# Patient Record
Sex: Female | Born: 1975 | Race: Black or African American | Hispanic: No | Marital: Married | Smoking: Never smoker
Health system: Southern US, Community
[De-identification: ages and names within clinical notes are randomized; demographics above are authoritative.]

## PROBLEM LIST (undated history)

## (undated) ENCOUNTER — Inpatient Hospital Stay (HOSPITAL_COMMUNITY): Payer: Self-pay

## (undated) DIAGNOSIS — F419 Anxiety disorder, unspecified: Secondary | ICD-10-CM

## (undated) DIAGNOSIS — I10 Essential (primary) hypertension: Secondary | ICD-10-CM

## (undated) DIAGNOSIS — O009 Unspecified ectopic pregnancy without intrauterine pregnancy: Secondary | ICD-10-CM

## (undated) DIAGNOSIS — Z5189 Encounter for other specified aftercare: Secondary | ICD-10-CM

## (undated) HISTORY — PX: NO PAST SURGERIES: SHX2092

---

## 1997-09-13 ENCOUNTER — Emergency Department (HOSPITAL_COMMUNITY): Admission: EM | Admit: 1997-09-13 | Discharge: 1997-09-13 | Payer: Self-pay | Admitting: Emergency Medicine

## 1999-06-14 ENCOUNTER — Other Ambulatory Visit: Admission: RE | Admit: 1999-06-14 | Discharge: 1999-06-14 | Payer: Self-pay | Admitting: Obstetrics and Gynecology

## 1999-06-15 ENCOUNTER — Other Ambulatory Visit: Admission: RE | Admit: 1999-06-15 | Discharge: 1999-06-15 | Payer: Self-pay | Admitting: Obstetrics and Gynecology

## 1999-06-15 ENCOUNTER — Encounter (INDEPENDENT_AMBULATORY_CARE_PROVIDER_SITE_OTHER): Payer: Self-pay | Admitting: Specialist

## 1999-07-23 ENCOUNTER — Encounter (INDEPENDENT_AMBULATORY_CARE_PROVIDER_SITE_OTHER): Payer: Self-pay

## 1999-07-23 ENCOUNTER — Other Ambulatory Visit: Admission: RE | Admit: 1999-07-23 | Discharge: 1999-07-23 | Payer: Self-pay | Admitting: Obstetrics and Gynecology

## 2000-12-01 ENCOUNTER — Other Ambulatory Visit: Admission: RE | Admit: 2000-12-01 | Discharge: 2000-12-01 | Payer: Self-pay | Admitting: Obstetrics and Gynecology

## 2003-02-13 ENCOUNTER — Emergency Department (HOSPITAL_COMMUNITY): Admission: EM | Admit: 2003-02-13 | Discharge: 2003-02-13 | Payer: Self-pay | Admitting: Emergency Medicine

## 2003-11-16 ENCOUNTER — Emergency Department (HOSPITAL_COMMUNITY): Admission: EM | Admit: 2003-11-16 | Discharge: 2003-11-16 | Payer: Self-pay | Admitting: Emergency Medicine

## 2003-11-24 ENCOUNTER — Emergency Department (HOSPITAL_COMMUNITY): Admission: EM | Admit: 2003-11-24 | Discharge: 2003-11-24 | Payer: Self-pay | Admitting: *Deleted

## 2007-02-16 ENCOUNTER — Inpatient Hospital Stay (HOSPITAL_COMMUNITY): Admission: AD | Admit: 2007-02-16 | Discharge: 2007-02-16 | Payer: Self-pay | Admitting: Obstetrics and Gynecology

## 2007-03-21 ENCOUNTER — Inpatient Hospital Stay (HOSPITAL_COMMUNITY): Admission: AD | Admit: 2007-03-21 | Discharge: 2007-03-21 | Payer: Self-pay | Admitting: Obstetrics and Gynecology

## 2007-05-06 ENCOUNTER — Inpatient Hospital Stay (HOSPITAL_COMMUNITY): Admission: AD | Admit: 2007-05-06 | Discharge: 2007-05-11 | Payer: Self-pay | Admitting: Obstetrics and Gynecology

## 2007-05-14 ENCOUNTER — Encounter: Admission: RE | Admit: 2007-05-14 | Discharge: 2007-06-13 | Payer: Self-pay | Admitting: Obstetrics and Gynecology

## 2007-06-15 ENCOUNTER — Encounter: Admission: RE | Admit: 2007-06-15 | Discharge: 2007-07-15 | Payer: Self-pay | Admitting: Obstetrics and Gynecology

## 2007-07-16 ENCOUNTER — Encounter: Admission: RE | Admit: 2007-07-16 | Discharge: 2007-08-14 | Payer: Self-pay | Admitting: Obstetrics and Gynecology

## 2007-08-15 ENCOUNTER — Encounter: Admission: RE | Admit: 2007-08-15 | Discharge: 2007-09-14 | Payer: Self-pay | Admitting: Obstetrics and Gynecology

## 2007-09-15 ENCOUNTER — Encounter: Admission: RE | Admit: 2007-09-15 | Discharge: 2007-10-15 | Payer: Self-pay | Admitting: Obstetrics and Gynecology

## 2007-10-02 ENCOUNTER — Emergency Department (HOSPITAL_COMMUNITY): Admission: EM | Admit: 2007-10-02 | Discharge: 2007-10-02 | Payer: Self-pay | Admitting: Emergency Medicine

## 2007-10-03 ENCOUNTER — Emergency Department (HOSPITAL_COMMUNITY): Admission: EM | Admit: 2007-10-03 | Discharge: 2007-10-03 | Payer: Self-pay | Admitting: Emergency Medicine

## 2007-10-16 ENCOUNTER — Encounter: Admission: RE | Admit: 2007-10-16 | Discharge: 2007-11-15 | Payer: Self-pay | Admitting: Obstetrics and Gynecology

## 2007-11-16 ENCOUNTER — Encounter: Admission: RE | Admit: 2007-11-16 | Discharge: 2007-12-16 | Payer: Self-pay | Admitting: Obstetrics and Gynecology

## 2007-12-17 ENCOUNTER — Encounter: Admission: RE | Admit: 2007-12-17 | Discharge: 2008-01-15 | Payer: Self-pay | Admitting: Obstetrics and Gynecology

## 2008-01-16 ENCOUNTER — Encounter: Admission: RE | Admit: 2008-01-16 | Discharge: 2008-02-15 | Payer: Self-pay | Admitting: Obstetrics and Gynecology

## 2008-02-16 ENCOUNTER — Encounter: Admission: RE | Admit: 2008-02-16 | Discharge: 2008-03-17 | Payer: Self-pay | Admitting: Obstetrics and Gynecology

## 2008-03-18 ENCOUNTER — Encounter: Admission: RE | Admit: 2008-03-18 | Discharge: 2008-04-14 | Payer: Self-pay | Admitting: Obstetrics and Gynecology

## 2008-04-15 ENCOUNTER — Encounter: Admission: RE | Admit: 2008-04-15 | Discharge: 2008-05-15 | Payer: Self-pay | Admitting: Obstetrics and Gynecology

## 2008-05-16 ENCOUNTER — Encounter: Admission: RE | Admit: 2008-05-16 | Discharge: 2008-06-14 | Payer: Self-pay | Admitting: Obstetrics and Gynecology

## 2008-06-08 ENCOUNTER — Inpatient Hospital Stay (HOSPITAL_COMMUNITY): Admission: AD | Admit: 2008-06-08 | Discharge: 2008-06-10 | Payer: Self-pay | Admitting: Obstetrics and Gynecology

## 2008-06-15 ENCOUNTER — Encounter: Admission: RE | Admit: 2008-06-15 | Discharge: 2008-07-15 | Payer: Self-pay | Admitting: Obstetrics and Gynecology

## 2008-07-16 ENCOUNTER — Encounter: Admission: RE | Admit: 2008-07-16 | Discharge: 2008-08-14 | Payer: Self-pay | Admitting: Obstetrics and Gynecology

## 2008-08-15 ENCOUNTER — Encounter: Admission: RE | Admit: 2008-08-15 | Discharge: 2008-09-14 | Payer: Self-pay | Admitting: Obstetrics and Gynecology

## 2008-09-15 ENCOUNTER — Encounter: Admission: RE | Admit: 2008-09-15 | Discharge: 2008-10-15 | Payer: Self-pay | Admitting: Obstetrics and Gynecology

## 2008-10-16 ENCOUNTER — Encounter: Admission: RE | Admit: 2008-10-16 | Discharge: 2008-11-14 | Payer: Self-pay | Admitting: Obstetrics and Gynecology

## 2008-11-15 ENCOUNTER — Encounter: Admission: RE | Admit: 2008-11-15 | Discharge: 2008-12-15 | Payer: Self-pay | Admitting: Obstetrics and Gynecology

## 2008-12-16 ENCOUNTER — Encounter: Admission: RE | Admit: 2008-12-16 | Discharge: 2009-01-14 | Payer: Self-pay | Admitting: Obstetrics and Gynecology

## 2009-01-15 ENCOUNTER — Encounter: Admission: RE | Admit: 2009-01-15 | Discharge: 2009-02-12 | Payer: Self-pay | Admitting: Obstetrics and Gynecology

## 2009-02-15 ENCOUNTER — Encounter: Admission: RE | Admit: 2009-02-15 | Discharge: 2009-03-17 | Payer: Self-pay | Admitting: Obstetrics and Gynecology

## 2009-03-18 ENCOUNTER — Encounter: Admission: RE | Admit: 2009-03-18 | Discharge: 2009-04-17 | Payer: Self-pay | Admitting: Obstetrics and Gynecology

## 2009-05-16 ENCOUNTER — Encounter: Admission: RE | Admit: 2009-05-16 | Discharge: 2009-06-15 | Payer: Self-pay | Admitting: Obstetrics and Gynecology

## 2009-06-16 ENCOUNTER — Encounter: Admission: RE | Admit: 2009-06-16 | Discharge: 2009-07-16 | Payer: Self-pay | Admitting: Obstetrics and Gynecology

## 2010-03-03 ENCOUNTER — Ambulatory Visit: Admit: 2010-03-03 | Payer: Self-pay | Admitting: Obstetrics and Gynecology

## 2010-05-26 LAB — URINALYSIS, ROUTINE W REFLEX MICROSCOPIC
Glucose, UA: NEGATIVE mg/dL
Ketones, ur: NEGATIVE mg/dL
Nitrite: NEGATIVE
Protein, ur: NEGATIVE mg/dL
pH: 7 (ref 5.0–8.0)

## 2010-05-26 LAB — CBC
HCT: 33.1 % — ABNORMAL LOW (ref 36.0–46.0)
MCHC: 34.3 g/dL (ref 30.0–36.0)
MCHC: 35 g/dL (ref 30.0–36.0)
MCV: 93.9 fL (ref 78.0–100.0)
Platelets: 183 10*3/uL (ref 150–400)
Platelets: 209 10*3/uL (ref 150–400)
Platelets: 212 10*3/uL (ref 150–400)
RBC: 3.23 MIL/uL — ABNORMAL LOW (ref 3.87–5.11)
RDW: 14.7 % (ref 11.5–15.5)
RDW: 15.3 % (ref 11.5–15.5)
WBC: 10.4 10*3/uL (ref 4.0–10.5)
WBC: 11.7 10*3/uL — ABNORMAL HIGH (ref 4.0–10.5)

## 2010-05-26 LAB — COMPREHENSIVE METABOLIC PANEL
ALT: 12 U/L (ref 0–35)
AST: 21 U/L (ref 0–37)
Albumin: 2.6 g/dL — ABNORMAL LOW (ref 3.5–5.2)
Alkaline Phosphatase: 163 U/L — ABNORMAL HIGH (ref 39–117)
CO2: 26 mEq/L (ref 19–32)
Chloride: 109 mEq/L (ref 96–112)
GFR calc non Af Amer: >60 mL/min (ref >60)
Potassium: 3.8 mEq/L (ref 3.5–5.1)
Total Bilirubin: 0.2 mg/dL — ABNORMAL LOW (ref 0.3–1.2)

## 2010-06-28 ENCOUNTER — Other Ambulatory Visit (HOSPITAL_COMMUNITY)
Admission: RE | Admit: 2010-06-28 | Discharge: 2010-06-28 | Disposition: A | Discharge status: Home / Self Care | Payer: BC Managed Care – PPO | Source: Ambulatory Visit | Attending: Obstetrics and Gynecology | Admitting: Obstetrics and Gynecology

## 2010-06-28 ENCOUNTER — Other Ambulatory Visit: Payer: Self-pay | Admitting: Obstetrics and Gynecology

## 2010-06-28 DIAGNOSIS — Z113 Encounter for screening for infections with a predominantly sexual mode of transmission: Secondary | ICD-10-CM | POA: Insufficient documentation

## 2010-06-28 DIAGNOSIS — Z01419 Encounter for gynecological examination (general) (routine) without abnormal findings: Secondary | ICD-10-CM | POA: Insufficient documentation

## 2010-06-29 NOTE — H&P (Signed)
[name], [name]           ACCOUNT NO.:  [id number]   MEDICAL RECORD NO.:  [id number]          PATIENT TYPE:  INP   LOCATION:  9166                          FACILITY:  [facility]   PHYSICIAN:  Hal Morales, M.D.DATE OF BIRTH:  12-07-1975   DATE OF ADMISSION:  06/08/2008  DATE OF DISCHARGE:                              HISTORY & PHYSICAL   The patient is a 35 year old married black female gravida 4, para 2-1-0-  3 at 40-1/7 weeks per an [facility] of June 07, 2008, who presented  unannounced in active labor.  She woke around 5 a.m. with labor onset.  She had a small amount of vaginal bleeding per report, no gushes of  fluid.  Denies nausea, vomiting, diarrhea, PIH or UTI signs or symptoms,  fever, cough or shortness of breath.  Reports good fetal movement.  Denies any HSV prodromal signs or symptoms.  She has been on suppressive  Valtrex since May 22, 2008, when a lesion was diagnosed by Dr. 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  She has been followed by the MD service at  [facility].   Her history is remarkable for:  1. History of preterm delivery x1.  That was in 1995 and was a 24-      weeker who weighed 3 pounds 12 ounces. At  this pregnancy she was      on 17P shots until around 29 weeks when she did have start having      some local reaction which was bothersome to her and therefore      discontinued.  2. History of LEEP.  3. History of postpartum pulmonary edema and cardiomegaly  4. History of PIH x3 pregnancies.  5. History of postpartum depression.  6. Questionable LMP.  7. History of rapid labor.   For contraception, she has used Depo in the past, history of Yasmin.  She did conceive on Micronor this pregnancy and is interested in the  Mirena versus postpartum BTL.   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  She did receive  a blood transfusion in 2009 during the postpartum period for  the  hemoglobin been 4.9.  She had a LEEP in 2001.  She reports an epidural  that she had with her first delivery came out, had to be put back in  and back problems since.  Her only other hospitalizations have been  with childbirth.   ALLERGIES:  She denies medication or latex allergies; no other  sensitivities.   OBSTETRICAL HISTORY:  Gravida 1:  Spontaneous vaginal delivery at 38  weeks, female, in September 1994; weight 5 pounds 6 ounces and 12 hours  of labor.  Did have an epidural.  No complications.  Gravida 2:  Spontaneous vaginal delivery, around 24 weeks in December  1995, a female, weight 3 pounds 12 ounces.  No anesthesia.  Premature  rupture of membranes and child was in the NICU for 2 months.  Gravida 3:  Spontaneous vaginal delivery at 37 weeks on March 2009, a  female, weight 5 pounds  15 ounces, at 37 weeks and did have an epidural.  Also had preeclampsia, postpartum cardiomegaly as well postpartum  pulmonary edema and postpartum anemia.  Hemoglobin was down to 4.9.   FAMILY HISTORY:  Remarkable for father chronic hypertension and on meds.  Mother varicosities.  Paternal grandmother rheumatoid arthritis.  She is  deceased.  Genetic history was remarkable for first cousin on her  father's side who was born with extra digits and the patient did have a  history of heart murmur as a child.   SOCIAL HISTORY:  She is a married African American female.  Her  husband's name is Jenne Campus.  The patient has a bachelor's degree  and was working full time in customer service  Father of the baby has an  associates degree and works full-time at [employer] and [employer].  She did  ingest alcohol prior to pregnancy but denies tobacco or illicit drug.   PRENATAL LABS:  Blood type is A positive, RPR nonreactive, rubella titer  immune.  Hepatitis surface antigen negative, HIV negative.  Her  gonorrhea and chlamydia cultures in the first trimester were negative.  Her GBS was positive in  her third trimester.  She had a normal 1-hour  Glucola. She had a normal first trimester screen as well as negative  AFP.   HISTORY OF PRESENT PREGNANCY:  She entered care October 1 with a  questionable LMP.  She had a BMI of 22.  She was normotensive at her  first visit.  Her pregravid weight was 127, at that visit she was 134.  She is 5 feet 4 inches.  She also had conceived on the pill.  She did  receive 17P injections with her pregnancy prior to this one.  The  patient was tearful at her first visit secondary to the unplanned  pregnancy.  She was referred to that her psychiatrist at that time for  perinatal depression.  She had an ultrasound at 11-4/7 weeks giving her  best [facility] of June 04, 1938.  She had a normal first trimester screen.  She was on Macrobid prophylactically for suspected UTI at 12-2/7 weeks.  She started on Zoloft somewhere at the end of her first trimester or  beginning of her second trimester for continued depression.  She had her  anatomy ultrasound at 19-2/7 weeks.  She is expecting her fourth girl.  Ultrasounds consistent with a previous dating.  Cervix 3.48 cm, normal  fluid, posterior placenta, three-vessel cord, all anatomy was seen with  normal growth and development.  The patient did consent to 17P vaccine,  which was started November 30 and she did continue that until  approximately 29 weeks when she started having some localized complaints  and it was discontinued.  Measuring slightly less than dates, beginning  around 23-25 weeks.  She had a 1-hour Glucola at 27-2/7 weeks.  Her  hemoglobin was 10.3 and was placed on iron.  Her 1-hour Glucola was  within normal limits equal to 109.  She had her ultrasound at 29-2/7  weeks for measuring size less than dates.  Had AFI of 60 percentile and  growth was 71st percentile.  Cervix was 3.7 cm.  She had ultrasound at  33-4/7 weeks again for a size less than dates  persisting and estimated  fetal weight at that  time was 4 pounds 15 ounces.  She had positive GBS  third trimester.  At 37-5/7 weeks on April 8, the cervix was 3-4 cm, 50%  and -  2 station.  She was seen by Dr. Stefano Gaul and had a raised  whitehead on right inner buttock which was tender.  She was placed  immediately on 500 mg of Valtrex p.o. b.i.d. for 3 days and then 500 q.  day and had HSV-1 and 2 serum glycoproteins that were drawn and they did  come back positive.  Both C-section and vaginal delivery were discussed.  The patient did desire vaginal delivery.  The patient has subsequently  been on Valtrex for greater than 10 days and then presented this morning  in active labor at 40-1/7 weeks.   OBJECTIVE:  Her initial blood pressure was 147/120, that was during a  contraction. Her heart rate was 96.  The repeat was 136/79 after  contraction and heart rate was 131.  Fetal heart rate is 135, moderate  variability.  She is having moderate variables, deepest nadir to upper  70s and duration lasting anywhere from 20-60 seconds with abrupt return  to baseline spontaneously. GENERAL:  She is alert, oriented x3.  She  does have very labored breathing, grimace and discomfort with her  contractions.  HEENT:  Within normal limits, grossly intact.  CARDIOVASCULAR:  Regular rate and rhythm without murmur.  LUNGS: Clear to auscultation bilaterally.  ABDOMEN: Soft, nontender and gravid.  PELVIC:  On speculum exam it was very difficult secondary to low fetal  station and membranes and fetal head noted just as blades placed into  vagina so difficulty viewing vaginal walls and could not view cervix.  Externally, there were no lesions noted.  There was a healed spot on her  right inner buttocks which was the diagnosis area.  EXTREMITIES:  Without edema and DTRs were 1+ and no clonus.   IMPRESSION:  1. Intrauterine pregnancy at 40-1/7 weeks.  2. Group B strep positive.  3. Transitional labor.  4. Intact membranes.  5. Recent diagnosis of herpes  simplex virus and has been on Valtrex      for greater than 10 days and without signs or symptoms.  6. History of depression, perinatal depression as well as postpartum      depression.   PLAN  1. Admit to birthing suites with Dr. Pennie Rushing as the attending      physician.  2. Routine L&D orders.  3. Ampicillin IV per GBS protocol for advanced dilatation.  4. MD is to follow.  5. Will closely monitor blood pressures as well as any signs or      symptoms of depression.      Candice [place], [place]      Hal Morales, M.D.  Electronically Signed    CHS/MEDQ  D:  06/08/2008  T:  06/08/2008  Job:  [id number]

## 2010-06-29 NOTE — H&P (Signed)
[name], [name]           ACCOUNT NO.:  [id number]   MEDICAL RECORD NO.:  [id number]          PATIENT TYPE:  MAT   LOCATION:  MATC                          [facility]   PHYSICIAN:  Osborn Coho, M.D.   DATE OF BIRTH:  02-29-1975   DATE OF ADMISSION:  03/21/2007  DATE OF DISCHARGE:  03/21/2007                              HISTORY & PHYSICAL   HISTORY OF PRESENT ILLNESS:  This patient is a 35 year old African  American married female who was evaluated at the office at 36-2/[redacted] weeks  gestation.  She is a gravida 3, para 1-1-0-2.  During her regular office  visit, she was noted to have elevated blood pressures of 138/90 and  140/100 and the she also had 2+ protein on a urine specimen.  Her cervix  was noted to be 2- to 3-cm dilated, 50% effaced, vertex -1.  Her  pregnancy has been followed by the [facility] MD service  and has been remarkable for:  1. Preterm delivery at 24 weeks.  2. History of LEEP.  3. History of PIH.   Her prenatal labs were collected on October 27, 2006, and showed:  Hemoglobin 13.1, hematocrit 38.7, platelets 300,000.  Blood type A+,  antibody negative, sickle cell trait negative.  RPR nonreactive, rubella  immune, hepatitis B surface antigen negative, HIV nonreactive.  Pap  smear from July 2008 was within normal limits.  Toxoplasmosis labs were  negative.  An AFP from December 14, 2006, was within normal limits.  One-hour  Glucola from February 28, 2007, was normal.   HISTORY OF PRESENT PREGNANCY:  The patient presented for care at [name] on October 27, 2006, at 9-3/[redacted] weeks gestation.  It was  recommended that she receive 17B injections during the pregnancy due to  her history of preterm delivery.  She had a normal first trimester  screen at 12 weeks' gestation.  She had a normal AFP from December 14, 2006.  Anatomy ultrasound at 19 weeks' gestation shows growth consistent  with previous dating confirming [name] of May 28, 2007.  The cervical  length at 23 weeks' gestation was 4.3 cm.  She continued to receive 17B  injections weekly.  Ultrasound at 29 weeks' gestation shows growth  consistent with previous dating.  Ultrasound at 30-1/2 weeks' gestation  showed dynamic cervix.  The range from 4 cm to 2.3 cm with fundal  pressure.  The rest of her prenatal care was unremarkable.  Her OB  history is gravida 3, para 1-1-0-2.  In September 2004, she had a  vaginal delivery of a female infant weighing 5 pounds 6 ounces at 53  weeks' gestation after 12 hours of labor.  She had an epidural for  anesthesia.  The infant's name was [name].  In December 1995, she had  a vaginal delivery of a 24-week infant weighing 3 pounds 12 ounces after  3 hours of labor.  She had no anesthesia.  She reports premature rupture  of membranes.  The infant's name was [name] and that infant was in  the NICU for two  months.  This third  pregnancy is the current  pregnancy.  She reports having PIH with her first pregnancy.   ALLERGIES:  NO MEDICATION ALLERGIES.   PAST GYN HISTORY:  She experienced menarche at the age of 72 with 28-day  cycles lasting 5 days.  She has used Depo-Provera in the past for  contraception, with her last injection being October 2007.  She also  used the Deazmen three 3 months after the Depo.  She had a LEEP  procedure in 2001.  She reports having had the usual childhood  illnesses.   PAST SURGICAL HISTORY:  Remarkable for the LEEP in 2001.  She also  reports having back problems after her epidural with her first delivery.   FAMILY HISTORY:  The patient's father with hypertension.  Paternal  grandmother with rheumatoid arthritis.   GENETIC HISTORY:  Her first cousins on her dad's side with polydactyly.  The patient reports having a heart murmur as an infant.   SOCIAL HISTORY:  She is married to the father of the baby.  His name is  [name].  He is involved and supportive.  The patient has her  bachelor's  degree and is employed full time for [name].  The father of  baby has as associate's degree and is employed full time for [name].  They deny any alcohol, tobacco or illicit drug use with the  pregnancy.   OBJECTIVE:  VITAL SIGNS:  Blood pressures in the office were 138/91,  40/100.  Other vital signs were stable.  She was afebrile.  HEENT: Grossly within normal limits.  CHEST:  Clear to auscultation.  HEART:  Regular rate and rhythm.  ABDOMEN:  Gravid in contour with fundal height extending approximately  36 cm above pubic symphysis.  Fetal heart tones were in the 150s.  Contractions were rare and mild and cervix was 2-3 cm, 50% effaced,  vertex -1.  EXTREMITIES:  Within normal limits.   ASSESSMENT:  1. Intrauterine pregnancy at 36-2/7 weeks.  2. Gestational hypertension:   PLAN:  Admit to neonatal unit for PIH labs, 24-hour urine collection as  well as serial blood pressures.      Cam Hai, C.N.M.      Osborn Coho, M.D.  Electronically Signed    KS/MEDQ  D:  05/01/2007  T:  05/02/2007  Job:  [id number]

## 2010-06-29 NOTE — H&P (Signed)
NAMEJAZZLENE, HUOT           ACCOUNT NO.:  [id number]   MEDICAL RECORD NO.:  [id number]          PATIENT TYPE:  INP   LOCATION:  9160                          [facility]ACILITY:  [facility]   PHYSICIAN:  Crist Fat. Rivard, M.D. DATE OF BIRTH:  23-Jun-1975   DATE OF ADMISSION:  05/06/2007  DATE OF DISCHARGE:                              HISTORY & PHYSICAL   HISTORY OF PRESENT ILLNESS:  Ms. Kassa is a 35 year old gravida 3,  para 1-1-0-2 at 37 weeks who presents today with elevated blood  pressures.  She had had elevated blood pressures in the office this past  week.  However, declined admission for induction or any further  evaluation.  She called the CNM on call tonight with a blood pressure  that she took at [facility] of 160/112.  She then was instructed to come to  maternity admissions for evaluation.  While in maternity admissions  unit, her blood pressure was in the 160s-170s over 110-117.  She also  was noted to have greater than 300 mg of protein on a voided specimen.  She was contracting approximately every 4-6 minutes very mildly, and her  cervix was favorable.  She was therefore admitted to the hospital for  induction secondary to preeclampsia.  This pregnancy has been remarkable  for:  1. Previous PIH and questionable preeclampsia with her first      pregnancy.  2. Previous preterm delivery at 24 weeks in 1995 with 17P given this      pregnancy.  3. History of LEEP procedure.  4. Group B strep negative.   PRENATAL LABORATORIES:  Blood type is A+, Rh antibody negative, VDRL  nonreactive, rubella titer positive, hepatitis B surface antigen  negative, HIV is nonreactive, toxoplasmosis titers were negative.  Cystic fibrosis testing was declined.  Sickle cell testing was negative.  Pap was normal in July 2008.  GC and Chlamydia cultures were negative.  In September, first trimester screening was normal.  AFP was normal.  She had a normal Glucola.  Hemoglobin upon entering the practice  was  13.1.  It was 10.8 at 27 weeks.  She had a fetal fibronectin done at 29  weeks secondary to some dynamic cervix findings.  This was negative.  Group B strep culture was negative at 36 weeks.   HISTORY OF PRESENT PREGNANCY:  The patient entered care at approximately  9 weeks and 3 days.  She did elect to proceed with 17P injections.  These were begun at 16 weeks.  She was treated for BV at her first  visit.  She did want first trimester screening which was normal.  She  had an ultrasound at 12 weeks showing normal growth and normal cervical  length.  She started 17P at 16 weeks.  She had an ultrasound at 18 weeks  showing growth equal to dates and a posterior placenta with cervical  length of 3.0.  the cervix was checked at 23 weeks with cervix being  closed at approximately 50% and presenting part was floating.  Cervical  length was 4.3 cm Glucola was given at 25 weeks which was normal.  She  had another ultrasound at 30 weeks.  The cervix was 2.8 cm in length  with normal fluid.  Ultrasound of the cervical length was repeated again  at 30 weeks.  Her cervix was closed, 70% vertex at -1 station.  Fetal  fibronectin was done.  Work was discontinued.  Ultrasound showed a  dynamic cervix going from 4 to 2.3 cm with fundal pressure fetal  fibronectin was negative.  She had an ultrasound for growth at 32 weeks  showing AFI 15 and dynamic cervix ranging for 3.5-2.94.  The rest of her  pregnancy was uncomplicated until her last visit with Dr. Stefano Gaul.  Her  cervix at that time was 2 cm.  However, she was noted to have elevated  blood pressure and protein in the urine.  She was sent to maternity  admissions unit.  She was recommended to go to maternity admissions unit  for evaluation.  She did decline to proceed with this.  She then elected  to take her blood pressure at [facility] at [facility] and it was found to be  elevated.   OBSTETRICAL HISTORY:  In 1994, she had a female infant, vaginal  delivery  at 5 pounds 6 ounces at 38 weeks.  She was in labor 12 hours she had  epidural anesthesia.  She did have PIH with that pregnancy, but also  describes been on magnesium sulfate, so there likely was some  preeclampsia there as well.  In 1995, she had a vaginal birth of a  female infant weight 3 pounds 12 ounces at 24 weeks.  She was in labor 3  hours.  She had premature rupture of membranes and then presented to the  hospital completely dilated and that child is doing well but was in the  NICU for two months   MEDICAL HISTORY:  1. She was on Depo-Provera and stopped in October 2007.  2. She has also used Yasmin.  3. She had a LEEP procedure in 2001.  4. She does have an indoor cat.  5. She reports usual childhood illnesses.  6. During her first delivery, she had an epidural which came out, had      to put it back in.  She has back problems after this incident.   ALLERGIES:  None.   FAMILY HISTORY:  The patient's father is hypertensive on medication.  Her mother has varicose veins.  Her paternal grandmother has rheumatoid  arthritis is now deceased.   GENETIC HISTORY:  Remarkable for first cousin on the Dad's side has  extra digit.  The patient did have heart murmur as a baby but has had no  issues since.   SOCIAL HISTORY:  The patient is Tree surgeon.  She denies any  religious affiliation.  She is married to the father of the baby.  He is  involved and supportive.  His name is Reymundo Poll. Aguayo.  This is a new  pregnancy with this partner.  The patient has a bachelor's degree.  She  is employed at [employer].  Her partner has an Associate's  degree.  He is employed at [employer].  She has been followed by  the physician [facility] at [facility].  She denies any alcohol,  drug or tobacco use during this pregnancy.   PHYSICAL EXAMINATION:  VITAL SIGNS:  Blood pressure is ranging from 160-  170 over 110-118.  Pulse is ranging from 90-107, O2 sats  were 99% on  room air respirations are  20.  HEENT: Within normal limits.  LUNGS:  Breath sounds are clear.  HEART:  Regular rate and rhythm without murmur.  BREASTS:  Soft, nontender.  ABDOMEN:  Fundal height is approximately 36 cm.  Estimated fetal weight  is approximately 6 pounds.  Uterine contractions every 4-6 minutes, mild  quality.  Cervix is 2 cm, 90% vertex at a minus one station with bulging  bag of water.  Fetal heart rate is reactive with segments of a negative  spontaneous CST.  EXTREMITIES:  Deep to reflexes are 1-2+ without clonus.  There is  essentially no edema noted.   LABORATORY DATA:  Labs tonight, urine sample, __________ shows specific  gravity of greater than a 1.030, 15 ketones, greater than 30 mg protein  in a voided specimen.  CBC shows hemoglobin of 10.6.  White blood cell  count of 12.4 and platelet count of 176.  Comprehensive metabolic panel  shows sodium 137, potassium 3.9, chloride 108, CO2 22, glucose 98, BUN  12, creatinine 1.02, SGOT of 18, SGPT of 9, alkaline phosphatase of 174.  LDH was 165 and uric acid was 6.8.   IMPRESSION:  1. Intrauterine pregnancy at 37 weeks.  2. Preeclampsia.  3. Favorable cervix.  4. Group B strep negative.   PLAN:  1. Admit to birthing suite per consult with Dr. Silverio Lay as      attending physician.  2. Routine physician orders.  3. Plan magnesium sulfate therapy for seizure prophylaxis.  4. Labetalol regimen to help control blood pressure.  5. Dr. Estanislado Pandy plans artificial rupture of membranes and Pitocin      augmentation.  However, the patient is reluctant to proceed with      this.  I did discuss with her the risks of continuing this      pregnancy in light of her severe preeclampsia which risks would      include seizure, morbidity, mortality of mother and fetus, stroke,      abruption, and other risks of severe hypertension and preeclampsia.      The patient at this time is willing to consider artificial  rupture      of membranes as an option for labor augmentation, but Dr. Estanislado Pandy      will also come in and speak with the patient regarding this issue.      Renaldo Reel Emilee Hero, C.N.M.      Crist Fat Rivard, M.D.  Electronically Signed    VLL/MEDQ  D:  05/06/2007  T:  05/07/2007  Job:  [id number]

## 2010-06-29 NOTE — Discharge Summary (Signed)
[name], Stewart           ACCOUNT NO.:  [id number]   MEDICAL RECORD NO.:  [id number]          PATIENT TYPE:  INP   LOCATION:  9374                          FACILITY:  [facility]   PHYSICIAN:  Janine Limbo, M.D.DATE OF BIRTH:  [birth year]   DATE OF ADMISSION:  05/06/2007  DATE OF DISCHARGE:  05/11/2007                               DISCHARGE SUMMARY   ADMITTING DIAGNOSES:  1. Intrauterine pregnancy at 37 weeks.  2. Pre-eclampsia.  3. Favorable cervix.  4. Group beta streptococcus negative.   DISCHARGE DIAGNOSES:  1. Intrauterine pregnancy at term.  2. Status post spontaneous vaginal delivery, March 23rd.  3. Postpartum pulmonary edema and cardiomegaly.  4. Severe postpartum anemia.  5. Breast feeding and as needed supplementation.  6. Postpartum hypertension.   PROCEDURES:  1. Epidural anesthesia.  2. Magnesium sulfate infusion, intrapartum and postpartum.  3. Blood transfusion 2 plus units of packed red blood cells      postpartum.  4. Chest x-ray x2.  5. CT angiography of chest.   HOSPITAL COURSE:  Ms. [name] is a 35 year old gravida 3, para 1-1-0-  2 at 37 weeks who presented on her day of admission with elevated blood  pressures.  The previous week prior to admission she had some elevated  blood pressures in the office; however, at that time she had declined  admission for induction or further evaluation. She called the on-call  CNM on her date of admission with reports of blood pressure at [facility] of  160/112.  She was then instructed to present to Maternity Admissions for  further evaluation.  Blood pressures on arrival were 160s to 170s over  110 to 117.  She did have 300 mg of protein on a voided specimen.  She  was contracting approximately every 4-6 minutes, mild on palpation, and  cervix was noted to be 2-cm, 90% effaced, vertex, and minus 1 station  with bulging bag of water.  CNM consulted with Dr. Estanislado Pandy, the on-call physician, on the patient's  date  of admission regarding her status and per recommendations, advised  induction of labor via artifical range of motion and Pitocin p.r.n.;  however, the patient was reluctant.  The risks of continuing her  pregnancy in light of pre-eclampsia were discussed with the patient  including risk of seizure, morbidity and mortality for fetus and mother,  stroke, abruption, as well as other risks of severe hypertension and pre-  eclampsia and after further discussion by CNM and Dr. Estanislado Pandy, the  patient did agree to proceed with induction of labor.  After admission,  the plan was made per Dr. Estanislado Pandy to initiate magnesium sulfate therapy  for seizure prophylaxis and labetalol p.r.n. for additional blood  pressure control.  The patient's labs, PIH labs on admission, sodium 137, potassium 3.9,  chloride 108, carbon dioxide 22, glucose was 98, BUN 12, creatinine  1.02.  SGOT 18, SGPT 9.  Uric acid 6.8.  LDH 165.  All those were within  normal limits.  Platelet count was within normal limits equal to 176.  At approximately 2245 on the 22nd, the patient did have magnesium  infusion  ongoing.  She had received labetalol 10 mg and 20 mg for blood  pressure greater than 160/105.  She denied any signs or symptoms of PIH.  Fetal heart tracing was reassuring.  She was contracting every 10  minutes but not perceived.  Following discussion the patient did accept  a ROM for induction of labor.  Her vaginal exam was 3-cm, 90%, vertex,  and minus 1.  She had ROM by Dr. Estanislado Pandy for clear fluid and the patient  was able to receive epidural p.r.n.  At approximately 0030 on the 23rd, Pitocin was at 6 milliunits per  minute.  She was contracting every 2-4 minutes and was comfortable  status post epidural placement.  Fetal heart tracing  remained  reassuring.  The patient received another dose of 20 mg of labetalol IV  and blood pressure decreased to 155/95.  The patient's O2 sats were  decreased but improved with O2 via  nasal cannula.  At 0200 on the 23rd, per delivery note the patient was complete at 1:26  a.m.  She did have a spontaneous vaginal delivery at 1:40 a.m.  Female  infant by the name of [name], weighing 5 pounds 15 ounces, with Apgar 9  and 9.  She had an intact perineum.  Placenta delivered spontaneously  and grossly intact.  Three-vessel cord.  EBL was 400.  The patient  tolerated well.  At 0740, [facility] labs were redrawn.  Hemoglobin was 9.6, platelets were  stable at 168, uric acid was stable.  Magnesium level was 4.8.  AST was  up to 20 and ALT was 9.  At 3:60 p.m. on the 23rd, the patient was complaining of feeling dizzy,  mild headache, and had a syncopal episode in the bathroom.  Blood  pressure was stable one teens to 130s over upper 60s to 70s.  Heart rate  was in low 100s.  Respirations 16.  Chest was clear.  Regular rate and  rhythm.  Fundus was firm.  She had 2-beats of clonus and lochia was  mild.  Hemoglobin at that check was down to 6.4 and the plan was made to  check orthostatics.  Continue Pitocin and IV fluids.  Transfusion was  discussed and plan to recheck mag level at 5 p.m.  At 7:10 a.m. on postpartum day #1, the patient was sitting up in her  bed.  She had reported dizziness with any further elevation of her head  at that time.  She had been up to the bathroom and reported dizziness  when up prior day with syncopal episode.  She denied headache, visual  changes, epigastric pain.  She was breast-feeding and bottle-feeding.  Blood pressure 110/60, pulse was 114, respirations 20, and afebrile.  Her blood pressure had ranged from 107-113 over 60-71.  Hemoglobin was  down to 4.9 from 6.4 the day before.  White count was stable at 14.4,  that was down from 16.9, platelets were 118 and that was down from 133.  Her lungs were clear.  Fundus was firm.  Lochia was scant to small.  Perineum was intact.  Extremities with DTRs 2 plus without clonus, 1  plus edema.  Consulted with Dr.  Su Hilt and the plan was made for the  patient to receive 3 units of packed red blood cells and risks and  benefits were reviewed with the patient and she was agreeable with the  plan.  The plan was made to repeat her PIH labs the following morning on  postpartum  day #2.  Magnesium was discontinued and strict I's and O's  were continued.  On postpartum day #2, PIH labs remained within normal limits.  SGOT was  19, SGPT was 10.  Calcium was 7.2.  Uric acid and LDH were pending at  the time of that note.  By 4:25 p.m. in the afternoon, on call CNM was  called to the patient's bedside secondary to shortness of breath and  chest pressure.  She was alert and oriented.  She was talking with her  family.  She had completed 2 units of her packed red blood cells and the  3rd was infusing.  Blood pressure had begun to elevated approximately 2  p.m., had been 135/86, where previously 103-126 over 59-80.  Blood  pressure at 3:15 was 165/102 and following was 143/121.  O2 sat at TPN  were 82-86%.  After being placed on O2 via nasal cannula 2L, O2 sats  were 92-93.  Blood pressure max was 167/101 at 3:49 p.m.  Pulse was 121.  Physical exam:  Her chest was clear.  No acute distress.  Regular rate  and rhythm.  Blood pressure at bedside 152/99, O2 sat was 93%, DTRs 1-2  plus without clonus but with 2 plus edema.  Dr. Su Hilt was updated and  the plan was made for the patient to receive spiral CT, repeat PIH labs,  and plan was made and the patient was transferred to the AICU.  The patient was seen by Dr. Su Hilt at 11 p.m. on March 24th.  She was  feeling a little better.  T-max was 99.1, blood pressure 157/87, O2 sats  were 96% on 2L nasal cannula.  Urine output was 2400 ml for 5 hours.  Abdomen was soft and nontender.  Edema 2 plus with no calf tenderness.  Hemoglobin was 8.7 at most recent check.  SGOT 22, SGPT was 12, LDH 253,  and uric acid was 7.4.  No pulmonary embolism was noted on her spiral   CT.  Positive diffuse pulmonary edema and questionable super imposed  pneumonia.  The plan was made for Lasix and continued O2 via nasal  cannula.  The patient was started on labetalol for postpartum  hypertension and plan was made for the patient to remain in the AICU.  By postpartum day #2, the patient reported she was feeling better from  previous day.  She was sitting in chair at bedside, still some shortness  of breath with 2L nasal cannula.  She was tolerating a regular diet,  voiding without difficulty.  Lochia was decreasing.  She denied any PIH  or UTI signs or symptoms.  She was breast-feeding as well as pumping and  supplementing p.r.n. with formula.  She denied dizziness or syncope.  She had minimal pain.  She was ready for discharge. Vital signs:  She  was afebrile.  Blood pressure ranged from 144-163 over 92-122.  Heart  rate 108-130.  Respirations 18-24.  SaO2 was 91-97% on 2L nasal cannula.  She did receive a p.r.n. IV labetalol dose at 4:03 that morning.  Hemoglobin was stable at 8.  It was 8.7 at previous draw.  White count  was stable at 16.5, that was down from 17.4.  Platelets were stable at  145 and had previously been 149.  CMP was within normal limits with the  exception of total protein was 4.5 which was down from 5.3.  Albumin 1.9  down from 2.2.  Calcium 7.7 which was stable.  Uric  acid was slightly  high at 8.1, that was up from 7.4.  LDH was 223 which was within normal  limits.  Urine output:  She had 600 ml spontaneous void since her Foley  was discontinued.   PHYSICAL EXAMINATION:  GENERAL:  She was alert and oriented, no acute  distress and pleasant.  SKIN:  Warm and dry.  LUNGS:  Clear to auscultation bilaterally.  CARDIOVASCULAR:  She had a tachy rate but regular rhythm.  ABDOMEN:  Soft, nontender.  Fundus below the umbilicus.  Lochia small,  rubra.  EXTREMITIES:  She had some generalized 1 plus edema in her bilateral  lower extremities.  Negative  Homan's.  No clonus.  DTRs 1 plus.  The patient was on prenatal vitamins as well as iron supplement for the  continued anemia.  Seen at 11:23 a.m. on March 25th by Dr. Normand Sloop.  The patient remained  without complaint.  No shortness of breath.  No chest pain.  She noted  lungs to be clear as well and minimal vaginal bleeding.  One plus edema.  No calf tenderness.  Per Dr. Normand Sloop, she started the patient on HCTZ.  She increased labetalol to 300 mg b.i.d. and did obtain a followup chest  x-ray.  By postpartum day #3, March 26th, the patient continued to be without  complaint.  She denied shortness of breath.  She reported that lochia  was like menses without clots.  She was afebrile.  Blood pressure ranged  150s to 160s over 90s to 110.  She had received an IV labetalol p.r.n.  dose for a blood pressure of 168/110 at 7:45 a.m.  Lungs clear.  Slightly decreased in her bases.  Weight was 162.  It was 168 on the  previous day.  She had good diuresis  O2 sat without oxygen were 91-95%.  Plan was made by Dr. Pennie Rushing that day, postpartum day #3, to increase  labetalol to 400 mg b.i.d. and to recheck her labs.  The patient did  voice desire for discharge home and plan was made by Dr. Pennie Rushing to  reassess later in the evening.  The patient continued to receive IV  Lasix p.r.n.  Dr. Pennie Rushing had returned to see the patient at 1900 on  postpartum day #3.  Blood pressure remained 150s over 90s.  Urine output  since her previous Lasix dose was 1000 mL.  Lungs remained clear and  plan was made to reassess following day regarding discharge.  By postpartum day #4, the patient was sitting at bedside in her chair.  She continued to verbalize request for discharge home.  She reported  that her milk was in, that her lochia was like a period.  She had normal  pain.  She denied chest pain or shortness of breath.  She was voiding  without difficulty.  No PIH signs or symptoms.  Tolerating a diet.  The  patient  remained afebrile.  She had blood pressure max at 6 a.m. of  160/107 and received p.o. labetalol dose early at 6.  Following that  dose, blood pressure 150/100.  Heart rate was ranging 81-101.  Blood  pressure ranged the previous day 138-173 over 82-107.  Respiratory rate  17-20.  SaO2 on room air was 91-95%.  She had a 99% x1.  She had good  urinary output.  Her weight yesterday was 73.6 kg and had been 75.6 the  day before.  CMP was within normal limits with the exception of total  protein,  it was low equal to 5 down from 5.4.  Albumin was low equal to  2 down from 2.3.  Calcium low equal to 7.8 and had been within normal  limits at 8.4 the day before.  Uric acid was high at 9.1, previously  8.6.  LDH was within normal limits equal to 203, had been high the  previous day before equal to 256.   PHYSICAL EXAMINATION:  LUNGS:  Remained clear but decreased sounds in  the bases.  No labored breathing.  She had equal expansion bilaterally.  ABDOMEN:  Soft.  Fundus firm and below umbilicus. Small rubor lochia.  EXTREMITIES:  One to 2 plus pitting edema bilateral lower extremities.  DTRs 1 plus.  Negative Homan's.  After being seen by Dr. Stefano Gaul, plan was made by Dr. Stefano Gaul to  discharge the patient home and she was deemed to have received full  benefit of her hospital stay.   DISCHARGE MEDICATIONS:  1. Labetalol 400 mg p.o. t.i.d.  2. HCTZ 25 mg p.o. daily.  3. Ambien 10 mg p.o. nightly p.r.n. insomnia.  4. Feosol 1 tablet by mouth b.i.d. with meals.  5. Stool softener 1 tablet p.o. once or twice a day p.r.n.      constipation.   DISCHARGE CONDITION:  Stable.   DISCHARGE FOLLOWUP:  The patient is to have [facility] RN to follow up  with patient either tomorrow or Monday for a blood pressure check and  the patient has an appointment in the office on Tuesday with Dr.  Stefano Gaul at 1:30 p.m. for followup as well.      Candice Denny Levy, [place]      Janine Limbo,  M.D.  Electronically Signed    CHS/MEDQ  D:  05/11/2007  T:  05/11/2007  Job:  [id number]

## 2010-08-11 ENCOUNTER — Other Ambulatory Visit: Payer: Self-pay | Admitting: Obstetrics and Gynecology

## 2010-11-04 LAB — STREP B DNA PROBE

## 2010-11-04 LAB — CBC
HCT: 31.3 — ABNORMAL LOW
Hemoglobin: 11 — ABNORMAL LOW
MCHC: 35.1
MCV: 93.4
Platelets: 275
RDW: 13.8

## 2010-11-04 LAB — URINALYSIS, ROUTINE W REFLEX MICROSCOPIC
Bilirubin Urine: NEGATIVE
Glucose, UA: NEGATIVE
Hgb urine dipstick: NEGATIVE
Ketones, ur: 15 — AB
Leukocytes, UA: NEGATIVE
pH: 6

## 2010-11-04 LAB — WET PREP, GENITAL

## 2010-11-04 LAB — GC/CHLAMYDIA PROBE AMP, GENITAL

## 2010-11-04 LAB — FETAL FIBRONECTIN: Fetal Fibronectin: NEGATIVE

## 2010-11-04 LAB — URINE MICROSCOPIC-ADD ON

## 2010-11-05 LAB — URINALYSIS, ROUTINE W REFLEX MICROSCOPIC
Ketones, ur: NEGATIVE
Nitrite: NEGATIVE
Protein, ur: NEGATIVE
pH: 8

## 2010-11-08 LAB — COMPREHENSIVE METABOLIC PANEL
ALT: 10
ALT: 11
ALT: 21
AST: 18
AST: 19
AST: 23
AST: 24
Albumin: 1.9 — ABNORMAL LOW
Albumin: 2.1 — ABNORMAL LOW
Albumin: 2.3 — ABNORMAL LOW
Alkaline Phosphatase: 110
Alkaline Phosphatase: 174 — ABNORMAL HIGH
Alkaline Phosphatase: 93
Alkaline Phosphatase: 96
BUN: 10
BUN: 10
BUN: 12
BUN: 12
CO2: 22
CO2: 25
CO2: 26
CO2: 26
Calcium: 7.2 — ABNORMAL LOW
Calcium: 7.7 — ABNORMAL LOW
Calcium: 7.8 — ABNORMAL LOW
Calcium: 8.4
Chloride: 108
Creatinine, Ser: 0.84
Creatinine, Ser: 0.88
GFR calc Af Amer: >60
GFR calc Af Amer: >60
GFR calc Af Amer: >60
GFR calc Af Amer: >60
GFR calc non Af Amer: 54 — ABNORMAL LOW
GFR calc non Af Amer: >60
GFR calc non Af Amer: >60
GFR calc non Af Amer: >60
Glucose, Bld: 84
Glucose, Bld: 88
Glucose, Bld: 91
Glucose, Bld: 94
Potassium: 3.7
Potassium: 3.9
Potassium: 4.1
Potassium: 4.5
Sodium: 135
Sodium: 138
Sodium: 139
Total Bilirubin: 0.6
Total Protein: 4.2 — ABNORMAL LOW
Total Protein: 4.5 — ABNORMAL LOW
Total Protein: 5 — ABNORMAL LOW
Total Protein: 5.1 — ABNORMAL LOW
Total Protein: 5.3 — ABNORMAL LOW
Total Protein: 5.4 — ABNORMAL LOW

## 2010-11-08 LAB — CBC
HCT: 25.3 — ABNORMAL LOW
HCT: 25.4 — ABNORMAL LOW
HCT: 27.8 — ABNORMAL LOW
HCT: 30.5 — ABNORMAL LOW
Hemoglobin: 6.4 — CL
Hemoglobin: 8 — ABNORMAL LOW
Hemoglobin: 8 — ABNORMAL LOW
Hemoglobin: 8.7 — ABNORMAL LOW
Hemoglobin: 8.8 — ABNORMAL LOW
MCHC: 34.1
MCHC: 34.6
MCHC: 34.6
MCHC: 34.9
MCV: 90.5
MCV: 92.1
MCV: 92.7
Platelets: 168
Platelets: 176
RBC: 1.51 — ABNORMAL LOW
RBC: 2.01 — ABNORMAL LOW
RBC: 2.46 — ABNORMAL LOW
RBC: 2.8 — ABNORMAL LOW
RBC: 3.29 — ABNORMAL LOW
RDW: 13.6
RDW: 13.7
RDW: 14.8
RDW: 14.9
RDW: 15.2
WBC: 12.4 — ABNORMAL HIGH
WBC: 16.9 — ABNORMAL HIGH

## 2010-11-08 LAB — URIC ACID
Uric Acid, Serum: 6.7
Uric Acid, Serum: 6.8
Uric Acid, Serum: 7.3 — ABNORMAL HIGH
Uric Acid, Serum: 7.4 — ABNORMAL HIGH
Uric Acid, Serum: 8.6 — ABNORMAL HIGH

## 2010-11-08 LAB — DIFFERENTIAL
Basophils Absolute: 0
Lymphocytes Relative: 12
Lymphs Abs: 2
Monocytes Absolute: 1.2 — ABNORMAL HIGH
Monocytes Relative: 7
Neutro Abs: 13.7 — ABNORMAL HIGH

## 2010-11-08 LAB — CROSSMATCH: Antibody Screen: NEGATIVE

## 2010-11-08 LAB — SAMPLE TO BLOOD BANK

## 2010-11-08 LAB — URINALYSIS, ROUTINE W REFLEX MICROSCOPIC
Bilirubin Urine: NEGATIVE
Ketones, ur: 15 — AB
Nitrite: NEGATIVE
Urobilinogen, UA: 0.2

## 2010-11-08 LAB — LACTATE DEHYDROGENASE
LDH: 203
LDH: 223
LDH: 232
LDH: 253 — ABNORMAL HIGH
LDH: 256 — ABNORMAL HIGH

## 2010-11-08 LAB — MAGNESIUM
Magnesium: 4.8 — ABNORMAL HIGH
Magnesium: 5.9 — ABNORMAL HIGH

## 2011-06-29 ENCOUNTER — Emergency Department (HOSPITAL_COMMUNITY)
Admission: EM | Admit: 2011-06-29 | Discharge: 2011-06-29 | Disposition: A | Discharge status: Home / Self Care | Payer: Self-pay

## 2011-07-08 ENCOUNTER — Emergency Department (HOSPITAL_COMMUNITY)
Admission: EM | Admit: 2011-07-08 | Discharge: 2011-07-09 | Disposition: A | Discharge status: Home / Self Care | Payer: BC Managed Care – PPO | Attending: Emergency Medicine | Admitting: Emergency Medicine

## 2011-07-08 ENCOUNTER — Encounter (HOSPITAL_COMMUNITY): Payer: Self-pay | Admitting: *Deleted

## 2011-07-08 DIAGNOSIS — G43909 Migraine, unspecified, not intractable, without status migrainosus: Secondary | ICD-10-CM | POA: Insufficient documentation

## 2011-07-08 DIAGNOSIS — I10 Essential (primary) hypertension: Secondary | ICD-10-CM | POA: Insufficient documentation

## 2011-07-08 HISTORY — DX: Essential (primary) hypertension: I10

## 2011-07-08 NOTE — ED Notes (Signed)
Headache for 2 days with nv.  History of the same

## 2011-07-09 MED ORDER — ONDANSETRON 4 MG PO TBDP
8.0000 mg | ORAL_TABLET | Freq: Once | ORAL | Status: AC
  Administered 2011-07-09: 8 mg via ORAL

## 2011-07-09 MED ORDER — KETOROLAC TROMETHAMINE 60 MG/2ML IM SOLN
60.0000 mg | Freq: Once | INTRAMUSCULAR | Status: AC
  Administered 2011-07-09: 60 mg via INTRAMUSCULAR
  Filled 2011-07-09: qty 2

## 2011-07-09 MED ORDER — ONDANSETRON 4 MG PO TBDP
ORAL_TABLET | ORAL | Status: AC
  Administered 2011-07-09: 8 mg via ORAL
  Filled 2011-07-09: qty 2

## 2011-07-09 MED ORDER — PROMETHAZINE HCL 25 MG/ML IJ SOLN
25.0000 mg | Freq: Once | INTRAMUSCULAR | Status: DC
  Filled 2011-07-09: qty 1

## 2011-07-09 MED ORDER — PROMETHAZINE HCL 25 MG PO TABS: 25.0000 mg | ORAL_TABLET | Freq: Four times a day (QID) | ORAL | Status: DC | PRN

## 2011-07-09 MED ORDER — ONDANSETRON HCL 4 MG PO TABS: 4.0000 mg | ORAL_TABLET | Freq: Four times a day (QID) | ORAL | Status: AC

## 2011-07-09 NOTE — ED Notes (Signed)
Pt states she feels her pain has reached the point to where she feels she can go home.

## 2011-07-09 NOTE — ED Notes (Signed)
Pt requesting nausea medication that does not make her tired b/c she needs to be at work soon.

## 2011-07-09 NOTE — ED Notes (Signed)
Pt states pain is diminishing to 5/10.

## 2011-07-09 NOTE — ED Provider Notes (Signed)
History     CSN: [id number]  Arrival date & time 07/08/11  2328   First MD Initiated Contact with Patient 07/09/11 0117      Chief Complaint  Patient presents with  . Headache    (Consider location/radiation/quality/duration/timing/severity/associated sxs/prior treatment) Patient is a 36 y.o. female presenting with headaches. The history is provided by the patient.  Headache  Associated symptoms include nausea and vomiting. Pertinent negatives include no fever.   the patient is a 36 year old, female, with a history of migraine headaches.  She complains of a throbbing right-sided headache with nausea, vomiting, and photophobia.  For the past 3 days.  She denies fevers, chills, neck pain, or rash.  She denies pain anywhere else.  Past Medical History  Diagnosis Date  . Hypertension     History reviewed. No pertinent past surgical history.  No family history on file.  History  Substance Use Topics  . Smoking status: Never Smoker   . Smokeless tobacco: Not on file  . Alcohol Use: No    OB History    Grav Para Term Preterm Abortions TAB SAB Ect Mult Living                  Review of Systems  Constitutional: Negative for fever and chills.  HENT: Negative for neck pain and neck stiffness.   Eyes: Positive for photophobia.  Gastrointestinal: Positive for nausea and vomiting.  Skin: Negative for rash.  Neurological: Positive for headaches.  Psychiatric/Behavioral: Negative for confusion.  All other systems reviewed and are negative.    Allergies  Review of patient's allergies indicates no known allergies.  Home Medications   Current Outpatient Rx  Name Route Sig Dispense Refill  . ACETAMINOPHEN 500 MG PO TABS Oral Take 1,000 mg by mouth every 6 (six) hours as needed. For headache    . THERA M PLUS PO TABS Oral Take 1 tablet by mouth daily.      BP 111/70  Pulse 87  Temp(Src) 98.3 F (36.8 C) (Oral)  Resp 14  SpO2 99%  LMP 07/07/2011  Physical Exam    Vitals reviewed. Constitutional: She is oriented to person, place, and time. She appears well-developed and well-nourished. No distress.  HENT:  Head: Normocephalic and atraumatic.  Eyes: Conjunctivae and EOM are normal. Pupils are equal, round, and reactive to light.  Neck: Normal range of motion. Neck supple.       No nuchal rigidity  Cardiovascular: Normal rate.   No murmur heard. Pulmonary/Chest: Effort normal. No respiratory distress.  Abdominal: Soft. There is no tenderness.  Musculoskeletal: Normal range of motion.  Neurological: She is alert and oriented to person, place, and time. No cranial nerve deficit.  Skin: Skin is warm and dry. No rash noted.  Psychiatric: She has a normal mood and affect. Thought content normal.    ED Course  Procedures (including critical care time) Migraine headache Normal.  Neurological examination, and mental status.  No evidence of systemic or CNS infection.  No tests indicated in the emergency department  Labs Reviewed - No data to display No results found.   No diagnosis found.  Feels better. Wants to go home.  MDM  Migraine headache        Cheri Guppy, MD 07/09/11 [phone]

## 2011-07-09 NOTE — Discharge Instructions (Signed)
Use phenergan and ibuprofen for headaches.  Phenergan can cause drowsiness so if you have to work use zofran for nausea and vomiting instead.  Return for uncontrolled symptoms.

## 2011-12-31 ENCOUNTER — Emergency Department (HOSPITAL_COMMUNITY)
Admission: EM | Admit: 2011-12-31 | Discharge: 2011-12-31 | Disposition: A | Discharge status: Home Health | Payer: BC Managed Care – PPO | Attending: Emergency Medicine | Admitting: Emergency Medicine

## 2011-12-31 ENCOUNTER — Encounter (HOSPITAL_COMMUNITY): Payer: Self-pay | Admitting: Physical Medicine and Rehabilitation

## 2011-12-31 DIAGNOSIS — I10 Essential (primary) hypertension: Secondary | ICD-10-CM | POA: Insufficient documentation

## 2011-12-31 DIAGNOSIS — R590 Localized enlarged lymph nodes: Secondary | ICD-10-CM

## 2011-12-31 DIAGNOSIS — R599 Enlarged lymph nodes, unspecified: Secondary | ICD-10-CM | POA: Insufficient documentation

## 2011-12-31 LAB — URINALYSIS, ROUTINE W REFLEX MICROSCOPIC
Bilirubin Urine: NEGATIVE
Glucose, UA: NEGATIVE mg/dL
Hgb urine dipstick: NEGATIVE
Specific Gravity, Urine: 1.027 (ref 1.005–1.030)

## 2011-12-31 LAB — WET PREP, GENITAL
Trich, Wet Prep: NONE SEEN
Yeast Wet Prep HPF POC: NONE SEEN

## 2011-12-31 LAB — POCT PREGNANCY, URINE: Preg Test, Ur: NEGATIVE

## 2011-12-31 MED ORDER — METRONIDAZOLE 500 MG PO TABS: 500.0000 mg | ORAL_TABLET | Freq: Two times a day (BID) | ORAL | Status: DC

## 2011-12-31 MED ORDER — DOXYCYCLINE HYCLATE 100 MG PO CAPS: 100.0000 mg | ORAL_CAPSULE | Freq: Two times a day (BID) | ORAL | Status: DC

## 2011-12-31 NOTE — ED Notes (Signed)
Pt presents to department for evaluation of RLQ abdominal pain and possible hernia. Ongoing for several days. 7/10 pain at the time. States mass "pops" out sometimes. 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 Denies urinary symptoms. She is alert and oriented x4.

## 2011-12-31 NOTE — ED Provider Notes (Signed)
Medical screening examination/treatment/procedure(s) were conducted as a shared visit with non-physician practitioner(s) and myself.  I personally evaluated the patient during the encounter  No obvious hernia or abscess noted on examination including limited bedside ultrasound which did not show obvious localized subcutaneous fluid collection.  Hurman Horn, MD 12/31/11 [phone]

## 2011-12-31 NOTE — ED Notes (Signed)
Assisted Provider with pelvic exam  Pt tolerated well.

## 2011-12-31 NOTE — ED Provider Notes (Signed)
History     CSN: [id number]  Arrival date & time 12/31/11  1021   First MD Initiated Contact with Patient 12/31/11 1136      Chief Complaint  Patient presents with  . Abdominal Pain    (Consider location/radiation/quality/duration/timing/severity/associated sxs/prior treatment) HPI Comments: Patient presents today with a chief complaint of pain and a mass on the right side of her mons pubis near her right inguinal ligament.  She reports that it has been there for the past week.  She reports that the mass is always there.  She thought that possibly it was a hernia and attempted to push it back, but was unable to do so.  She has not noticed any drainage from the area.  No overlying erythema.  She has never had anything like this before.  She denies nausea or vomiting.  Denies fever or chills.  Denies constipation or diarrhea.  Denies vaginal discharge.  Denies dysuria, increased urinary frequency, or urgency.  No treatment prior to arrival in the ED.  The history is provided by the patient.    Past Medical History  Diagnosis Date  . Hypertension     No past surgical history on file.  No family history on file.  History  Substance Use Topics  . Smoking status: Never Smoker   . Smokeless tobacco: Not on file  . Alcohol Use: No    OB History    Grav Para Term Preterm Abortions TAB SAB Ect Mult Living                  Review of Systems  Genitourinary: Negative for pelvic pain.  Skin: Negative for color change.  All other systems reviewed and are negative.    Allergies  Review of patient's allergies indicates no known allergies.  Home Medications   Current Outpatient Rx  Name  Route  Sig  Dispense  Refill  . THERA M PLUS PO TABS   Oral   Take 1 tablet by mouth daily.           BP 118/72  Pulse 79  Temp 98 F (36.7 C) (Oral)  Resp 20  SpO2 100%  Physical Exam  Nursing note and vitals reviewed. Constitutional: She appears well-developed and  well-nourished.  HENT:  Head: Normocephalic and atraumatic.  Eyes: EOM are normal. Pupils are equal, round, and reactive to light.  Cardiovascular: Normal rate, regular rhythm and normal heart sounds.   Pulmonary/Chest: Effort normal and breath sounds normal.  Abdominal: Soft. Bowel sounds are normal. She exhibits no distension and no mass. There is no tenderness. There is no rebound and no guarding.  Genitourinary: There is no lesion on the right labia. There is no lesion on the left labia. Cervix exhibits no motion tenderness. Right adnexum displays no mass, no tenderness and no fullness. Left adnexum displays no mass, no tenderness and no fullness.  Musculoskeletal: Normal range of motion.  Lymphadenopathy:       Right: Inguinal adenopathy present.       Left: Inguinal adenopathy present.  Neurological: She is alert.  Skin: Skin is warm and dry.     Psychiatric: She has a normal mood and affect.    ED Course  Procedures (including critical care time)  Labs Reviewed  URINALYSIS, ROUTINE W REFLEX MICROSCOPIC - Abnormal; Notable for the following:    Ketones, ur 15 (*)     All other components within normal limits   No results found.   No diagnosis found.  Bedside ultrasound did not show any fluid collection  MDM  Patient presents with swollen area of the right groin area. Bedside ultrasound did not show an abscess.  No hernia on exam.  Suspect that area may be enlarged inguinal lymph nodes.  Patient discharged home with Doxycycline.  Wet prep showed BV.  Patient prescribed Flagyl.        Pascal Lux [place], PA-C 01/01/12 [phone]

## 2011-12-31 NOTE — ED Notes (Signed)
Provider at bedside with Ultra sound machine

## 2012-01-01 ENCOUNTER — Encounter (HOSPITAL_COMMUNITY): Payer: Self-pay

## 2012-01-01 ENCOUNTER — Emergency Department (HOSPITAL_COMMUNITY)
Admission: EM | Admit: 2012-01-01 | Discharge: 2012-01-02 | Disposition: A | Discharge status: Home / Self Care | Payer: BC Managed Care – PPO | Attending: Emergency Medicine | Admitting: Emergency Medicine

## 2012-01-01 DIAGNOSIS — I898 Other specified noninfective disorders of lymphatic vessels and lymph nodes: Secondary | ICD-10-CM | POA: Insufficient documentation

## 2012-01-01 DIAGNOSIS — R591 Generalized enlarged lymph nodes: Secondary | ICD-10-CM

## 2012-01-01 DIAGNOSIS — R197 Diarrhea, unspecified: Secondary | ICD-10-CM | POA: Insufficient documentation

## 2012-01-01 DIAGNOSIS — Z79899 Other long term (current) drug therapy: Secondary | ICD-10-CM | POA: Insufficient documentation

## 2012-01-01 DIAGNOSIS — R110 Nausea: Secondary | ICD-10-CM | POA: Insufficient documentation

## 2012-01-01 DIAGNOSIS — I10 Essential (primary) hypertension: Secondary | ICD-10-CM | POA: Insufficient documentation

## 2012-01-01 LAB — GC/CHLAMYDIA PROBE AMP
CT Probe RNA: NEGATIVE
GC Probe RNA: NEGATIVE

## 2012-01-01 NOTE — ED Notes (Signed)
Per pt, seen at [facility] for abscess.  Placed on antibiotics.  Pt now having nausea and diarrhea.  Also, incision is more painful.

## 2012-01-02 MED ORDER — OXYCODONE-ACETAMINOPHEN 5-325 MG PO TABS: 2.0000 | ORAL_TABLET | ORAL | Status: DC | PRN

## 2012-01-02 NOTE — ED Provider Notes (Signed)
History     CSN: [id number]  Arrival date & time 01/01/12  2301   First MD Initiated Contact with Patient 01/02/12 0016      Chief Complaint  Patient presents with  . Abscess  . Nausea    (Consider location/radiation/quality/duration/timing/severity/associated sxs/prior treatment) The history is provided by the patient.  [name] is a 36 y.o. female hx of HTN here with worsening pain in R inguinal area and diarrhea. Was seen 2 days ago with R groin swelling. US showed no abscess but an incision was attempted with no purulent drainage from wound. She was prescribed doxycycline for cellulitis and flagyl for possible BV. She said that the wound still hurts her but denies purulent drainage. She has more pain when she bends down and she is going back to work tomorrow that involving heavy lifting. Denies fever or chills. She also has slight nausea and diarrhea after taking the antibiotics but denies abdominal pain. She thinks that its from the medications.    Past Medical History  Diagnosis Date  . Hypertension     History reviewed. No pertinent past surgical history.  History reviewed. No pertinent family history.  History  Substance Use Topics  . Smoking status: Never Smoker   . Smokeless tobacco: Not on file  . Alcohol Use: No    OB History    Grav Para Term Preterm Abortions TAB SAB Ect Mult Living                  Review of Systems  Gastrointestinal: Positive for nausea and diarrhea.  Musculoskeletal:       Groin pain   All other systems reviewed and are negative.    Allergies  Review of patient's allergies indicates no known allergies.  Home Medications   Current Outpatient Rx  Name  Route  Sig  Dispense  Refill  . IBUPROFEN 200 MG PO TABS   Oral   Take 400 mg by mouth every 6 (six) hours as needed. For pain         . THERA M PLUS PO TABS   Oral   Take 1 tablet by mouth daily.         Marland Kitchen DOXYCYCLINE HYCLATE 100 MG PO CAPS   Oral   Take  1 capsule (100 mg total) by mouth 2 (two) times daily.   20 capsule   0   . METRONIDAZOLE 500 MG PO TABS   Oral   Take 1 tablet (500 mg total) by mouth 2 (two) times daily.   14 tablet   0     BP 105/64  Pulse 80  Temp 98.3 F (36.8 C) (Oral)  Resp 16  SpO2 100%  LMP 12/17/2011  Physical Exam  Nursing note and vitals reviewed. Constitutional: She is oriented to person, place, and time. She appears well-developed and well-nourished.       NAD   HENT:  Head: Normocephalic.  Mouth/Throat: Oropharynx is clear and moist.  Eyes: Conjunctivae normal are normal. Pupils are equal, round, and reactive to light.  Neck: Normal range of motion. Neck supple.  Cardiovascular: Normal rate.   Pulmonary/Chest: Effort normal.  Abdominal: Soft. Bowel sounds are normal. She exhibits no distension. There is no tenderness.  Musculoskeletal: Normal range of motion.       R groin there is a tender lymph node. Possibly mild cellulitis overlying it. The incision is healing without drainage expressed. No fluctuance.   Neurological: She is alert and oriented to  person, place, and time.  Skin: Skin is warm and dry.  Psychiatric: She has a normal mood and affect. Her behavior is normal. Judgment and thought content normal.    ED Course  Procedures (including critical care time)  Labs Reviewed - No data to display No results found.   No diagnosis found.    MDM  [name] is a 36 y.o. female here with worsening pain on R groin and nausea and diarrhea. There is no evidence of abscess currently. I recommend continuing with abx and no heavy lifting for a week. I will also give her pain meds. I counseled her that the nausea and diarrhea is likely secondary to abx. Return precautions given.          Richardean Canal, MD 01/02/12 [phone]

## 2012-01-04 NOTE — ED Provider Notes (Signed)
Medical screening examination/treatment/procedure(s) were conducted as a shared visit with non-physician practitioner(s) and myself.  I personally evaluated the patient during the encounter.  NT inguinal right subcut mass suspect node; no fluid collection noted to suggest abscess with bedside limited US.  Hurman Horn, MD 01/04/12 [phone]

## 2012-03-28 ENCOUNTER — Emergency Department (HOSPITAL_COMMUNITY)
Admission: EM | Admit: 2012-03-28 | Discharge: 2012-03-28 | Disposition: A | Discharge status: Home / Self Care | Payer: BC Managed Care – PPO | Source: Home / Self Care | Attending: Emergency Medicine | Admitting: Emergency Medicine

## 2012-03-28 ENCOUNTER — Encounter (HOSPITAL_COMMUNITY): Payer: Self-pay

## 2012-03-28 DIAGNOSIS — G43909 Migraine, unspecified, not intractable, without status migrainosus: Secondary | ICD-10-CM

## 2012-03-28 DIAGNOSIS — R110 Nausea: Secondary | ICD-10-CM

## 2012-03-28 LAB — POCT PREGNANCY, URINE: Preg Test, Ur: NEGATIVE

## 2012-03-28 MED ORDER — ONDANSETRON 4 MG PO TBDP
ORAL_TABLET | ORAL | Status: AC
  Filled 2012-03-28: qty 1

## 2012-03-28 MED ORDER — KETOROLAC TROMETHAMINE 10 MG PO TABS: 10.0000 mg | ORAL_TABLET | Freq: Four times a day (QID) | ORAL | Status: DC | PRN

## 2012-03-28 MED ORDER — ONDANSETRON HCL 4 MG PO TABS: 4.0000 mg | ORAL_TABLET | Freq: Three times a day (TID) | ORAL | Status: DC | PRN

## 2012-03-28 MED ORDER — ONDANSETRON 4 MG PO TBDP: 4.0000 mg | ORAL_TABLET | Freq: Once | ORAL | Status: DC

## 2012-03-28 MED ORDER — ONDANSETRON 4 MG PO TBDP
4.0000 mg | ORAL_TABLET | Freq: Once | ORAL | Status: AC
  Administered 2012-03-28: 4 mg via ORAL

## 2012-03-28 MED ORDER — KETOROLAC TROMETHAMINE 30 MG/ML IJ SOLN
30.0000 mg | Freq: Once | INTRAMUSCULAR | Status: AC
  Administered 2012-03-28: 30 mg via INTRAMUSCULAR

## 2012-03-28 NOTE — ED Provider Notes (Signed)
A History     CSN: [id number]  Arrival date & time 03/28/12  1055   First MD Initiated Contact with Patient 03/28/12 1127      Chief Complaint  Patient presents with  . Migraine    (Consider location/radiation/quality/duration/timing/severity/associated sxs/prior treatment) HPI Comments: Patient presents urgent care describing a new lead develop migraine episode started yesterday with nausea vomiting and photophobia. Patient denies any fevers, no further neurological symptoms such as upper or lower remedy weakness, paresthesias, dizziness, gait or ambulation changes. She describes this as a typical migraine headache for her. She does have them every few months.  Patient is a 37 y.o. female presenting with migraines. The history is provided by the patient.  Migraine This is a new problem. The current episode started yesterday. The problem occurs constantly. The problem has been gradually worsening. Associated symptoms include headaches. Pertinent negatives include no abdominal pain and no shortness of breath. Exacerbated by: Movement noise activity in light. The symptoms are relieved by NSAIDs. She has tried acetaminophen for the symptoms. The treatment provided moderate relief.    Past Medical History  Diagnosis Date  . Hypertension     History reviewed. No pertinent past surgical history.  History reviewed. No pertinent family history.  History  Substance Use Topics  . Smoking status: Never Smoker   . Smokeless tobacco: Not on file  . Alcohol Use: No    OB History   Grav Para Term Preterm Abortions TAB SAB Ect Mult Living                  Review of Systems  Constitutional: Positive for activity change. Negative for fever, chills, appetite change and fatigue.  HENT: Negative for ear pain, nosebleeds, facial swelling, trouble swallowing, neck pain, neck stiffness and tinnitus.   Respiratory: Negative for shortness of breath.   Gastrointestinal: Positive for nausea and  vomiting. Negative for abdominal pain.  Neurological: Positive for headaches. Negative for dizziness, tremors, seizures, syncope, facial asymmetry, speech difficulty, weakness, light-headedness and numbness.    Allergies  Review of patient's allergies indicates no known allergies.  Home Medications   Current Outpatient Rx  Name  Route  Sig  Dispense  Refill  . ketorolac (TORADOL) 10 MG tablet   Oral   Take 1 tablet (10 mg total) by mouth every 6 (six) hours as needed for pain.   10 tablet   0   . metroNIDAZOLE (FLAGYL) 500 MG tablet   Oral   Take 1 tablet (500 mg total) by mouth 2 (two) times daily.   14 tablet   0   . Multiple Vitamins-Minerals (MULTIVITAMINS THER. W/MINERALS) TABS   Oral   Take 1 tablet by mouth daily.         . ondansetron (ZOFRAN) 4 MG tablet   Oral   Take 1 tablet (4 mg total) by mouth every 8 (eight) hours as needed for nausea.   10 tablet   0   . ondansetron (ZOFRAN-ODT) 4 MG disintegrating tablet   Oral   Take 1 tablet (4 mg total) by mouth once.   20 tablet   0     BP 111/60  Pulse 89  Temp(Src) 97.8 F (36.6 C) (Oral)  Resp 18  SpO2 100%  Physical Exam  Nursing note and vitals reviewed. Constitutional: She is oriented to person, place, and time. Vital signs are normal. She appears well-developed and well-nourished.  Non-toxic appearance. She does not have a sickly appearance.  HENT:  Head:  Normocephalic.  Eyes: Conjunctivae are normal. Pupils are equal, round, and reactive to light.  Neck: Trachea normal and normal range of motion. Neck supple. No JVD present. No Kernig's sign noted.  Cardiovascular: Normal rate.   Pulmonary/Chest: Effort normal and breath sounds normal.  Neurological: She is alert and oriented to person, place, and time. She has normal strength. No cranial nerve deficit or sensory deficit. She exhibits normal muscle tone. She displays a negative Romberg sign. Coordination normal. GCS eye subscore is 4. GCS verbal  subscore is 5. GCS motor subscore is 6.  Skin: No erythema.    ED Course  Procedures (including critical care time)  Labs Reviewed  POCT PREGNANCY, URINE   No results found.  Patient will have been provided  1. Migraine   2. Nausea    Patient administered 30 mg of Toradol IM and an oral dose of Zofran with clinical improvement  MDM  Migraine- episode. Patient will be provider work note for 48 hours to stay at home and calm dark environment. Have been provided with 6 pills of Toradol orally as well as Zofran.Work nore provided to stay home for 48 hours        Jimmie Molly, MD 03/28/12 1238

## 2012-03-28 NOTE — ED Notes (Signed)
States she has episodic migraine HA, and they usually last 3 days; on no maintenance or prevention Rx, uses alternative home treatments. HA onset yesterday, pain behind her eyes w nausea; NAD; drove herself to [facility]

## 2014-05-26 ENCOUNTER — Encounter (HOSPITAL_COMMUNITY): Payer: Self-pay | Admitting: Emergency Medicine

## 2014-05-26 ENCOUNTER — Emergency Department (HOSPITAL_COMMUNITY)
Admission: EM | Admit: 2014-05-26 | Discharge: 2014-05-26 | Disposition: A | Discharge status: Home / Self Care | Payer: Managed Care, Other (non HMO) | Attending: Emergency Medicine | Admitting: Emergency Medicine

## 2014-05-26 DIAGNOSIS — J029 Acute pharyngitis, unspecified: Secondary | ICD-10-CM

## 2014-05-26 DIAGNOSIS — R042 Hemoptysis: Secondary | ICD-10-CM | POA: Diagnosis not present

## 2014-05-26 DIAGNOSIS — Z792 Long term (current) use of antibiotics: Secondary | ICD-10-CM | POA: Diagnosis not present

## 2014-05-26 LAB — RAPID STREP SCREEN (MED CTR MEBANE ONLY): Streptococcus, Group A Screen (Direct): NEGATIVE

## 2014-05-26 MED ORDER — NAPROXEN 500 MG PO TABS: 500.0000 mg | ORAL_TABLET | Freq: Two times a day (BID) | ORAL | Status: DC

## 2014-05-26 MED ORDER — KETOROLAC TROMETHAMINE 60 MG/2ML IM SOLN
60.0000 mg | Freq: Once | INTRAMUSCULAR | Status: AC
  Administered 2014-05-26: 60 mg via INTRAMUSCULAR
  Filled 2014-05-26: qty 2

## 2014-05-26 NOTE — ED Provider Notes (Signed)
CSN: [id number]     Arrival date & time 05/26/14  9147 History   First MD Initiated Contact with Patient 05/26/14 [phone]     Chief Complaint  Patient presents with  . Oral Swelling  . Sore Throat  . Hemoptysis     (Consider location/radiation/quality/duration/timing/severity/associated sxs/prior Treatment) HPI Comments: Patient with no significant past medical history presents with complaint of sore throat 1 week. Symptoms began as a mild sore throat and worsened. She also had congestion and a runny nose at the onset. Patient states she had a low-grade fever 3 days ago. Pain is worse with swallowing and talking. No chest pain or shortness of breath. She denies cough. Patient states that she noted some streaks of blood when she cleared her throat this morning and this concerned her. No voice change. Taking OTC Nyquil/Dayquil at home. Also took several doses of husband's penicillin without relief. The onset of this condition was acute.   Patient is a 39 y.o. female presenting with pharyngitis. The history is provided by the patient.  Sore Throat Associated symptoms include congestion, a fever and a sore throat. Pertinent negatives include no abdominal pain, chills, coughing, fatigue, headaches, myalgias, nausea, rash or vomiting.    No past medical history on file. History reviewed. No pertinent past surgical history. No family history on file. History  Substance Use Topics  . Smoking status: Never Smoker   . Smokeless tobacco: Not on file  . Alcohol Use: No   OB History    No data available     Review of Systems  Constitutional: Positive for fever. Negative for chills and fatigue.  HENT: Positive for congestion, rhinorrhea and sore throat. Negative for ear pain and sinus pressure.   Eyes: Negative for redness.  Respiratory: Negative for cough and wheezing.   Gastrointestinal: Negative for nausea, vomiting, abdominal pain and diarrhea.  Genitourinary: Negative for dysuria.   Musculoskeletal: Negative for myalgias and neck stiffness.  Skin: Negative for rash.  Neurological: Negative for headaches.  Hematological: Negative for adenopathy.    Allergies  Review of patient's allergies indicates no known allergies.  Home Medications   Prior to Admission medications   Medication Sig Start Date End Date Taking? Authorizing Provider  penicillin v potassium (VEETID) 500 MG tablet Take 500 mg by mouth 4 (four) times daily.   Yes Historical Provider, MD  ketorolac (TORADOL) 10 MG tablet Take 1 tablet (10 mg total) by mouth every 6 (six) hours as needed for pain. Patient not taking: Reported on 05/26/2014 03/28/12   Jimmie Molly, MD  metroNIDAZOLE (FLAGYL) 500 MG tablet Take 1 tablet (500 mg total) by mouth 2 (two) times daily. Patient not taking: Reported on 05/26/2014 12/31/11   Santiago Glad, PA-C  ondansetron (ZOFRAN) 4 MG tablet Take 1 tablet (4 mg total) by mouth every 8 (eight) hours as needed for nausea. Patient not taking: Reported on 05/26/2014 03/28/12   Jimmie Molly, MD  ondansetron (ZOFRAN-ODT) 4 MG disintegrating tablet Take 1 tablet (4 mg total) by mouth once. Patient not taking: Reported on 05/26/2014 03/28/12   Jimmie Molly, MD   BP 127/81 mmHg  Pulse 92  Temp(Src) 98.4 F (36.9 C) (Oral)  Resp 18  Ht  (1.6 m)  Wt 138 lb (62.596 kg)  BMI 24.45 kg/m2  SpO2 100%  LMP 05/12/2014   Physical Exam  Constitutional: She appears well-developed and well-nourished.  HENT:  Head: Normocephalic and atraumatic.  Right Ear: Tympanic membrane, external ear and ear canal normal.  Left Ear: Tympanic membrane, external ear and ear canal normal.  Nose: Nose normal. No mucosal edema or rhinorrhea.  Mouth/Throat: Uvula is midline and mucous membranes are normal. Mucous membranes are not dry. No oral lesions. No trismus in the jaw. No uvula swelling. Oropharyngeal exudate, posterior oropharyngeal edema and posterior oropharyngeal erythema present. No tonsillar  abscesses.  Eyes: Conjunctivae are normal. Right eye exhibits no discharge. Left eye exhibits no discharge.  Neck: Normal range of motion. Neck supple.  Full ROM of neck  Cardiovascular: Normal rate, regular rhythm and normal heart sounds.   Pulmonary/Chest: Effort normal and breath sounds normal. No respiratory distress. She has no wheezes. She has no rales.  Abdominal: Soft. There is no tenderness.  Lymphadenopathy:    She has no cervical adenopathy.  Neurological: She is alert.  Skin: Skin is warm and dry.  Psychiatric: She has a normal mood and affect.  Nursing note and vitals reviewed.   ED Course  Procedures (including critical care time) Labs Review Labs Reviewed  RAPID STREP SCREEN  CULTURE, GROUP A STREP    Imaging Review No results found.   EKG Interpretation None       7:28 AM Patient seen and examined. Work-up initiated. Medications ordered.   Vital signs reviewed and are as follows: BP 127/81 mmHg  Pulse 92  Temp(Src) 98.4 F (36.9 C) (Oral)  Resp 18  Ht 5\' 3"  (1.6 m)  Wt 138 lb (62.596 kg)  BMI 24.45 kg/m2  SpO2 100%  LMP 05/12/2014  8:00 AM Strep neg. Pt informed, counseled on supportive measures. Patient urged to return with worsening symptoms or other concerns. Patient verbalized understanding and agrees with plan.    MDM   Final diagnoses:  Pharyngitis   Patient with sore throat, other cold symptoms 1 week. No signs of peritonsillar abscess today. Full range of motion of neck and no high fever or immunocompromised state to suggest retropharyngeal abscess. Handling her secretions, do not suspect epiglottitis. No indication for CT at this time. Supportive management indicated. Patient appears very well. D/c to home.    Renne CriglerJoshua Chelesea Weiand, PA-C 05/26/14 [id number]  Azalia BilisKevin Campos, MD 05/26/14 [phone]

## 2014-05-26 NOTE — ED Notes (Signed)
Pt also reports she started coughing up blood this morning.

## 2014-05-26 NOTE — Discharge Instructions (Signed)
Please read and follow all provided instructions.  Your diagnoses today include:  1. Pharyngitis    Tests performed today include:  Strep test: was negative for strep throat  Strep culture: you will be notified if this comes back positive  Vital signs. See below for your results today.   Medications prescribed:   Naproxen - anti-inflammatory pain medication  Do not exceed 500mg  naproxen every 12 hours, take with food  You have been prescribed an anti-inflammatory medication or NSAID. Take with food. Take smallest effective dose for the shortest duration needed for your pain. Stop taking if you experience stomach pain or vomiting.   Home care instructions:  Please read the educational materials provided and follow any instructions contained in this packet.  Follow-up instructions: Please follow-up with your primary care provider as needed for further evaluation of your symptoms.   Return instructions:   Please return to the Emergency Department if you experience worsening symptoms.   Return if you have worsening problems swallowing, your neck becomes swollen, you cannot swallow your saliva or your voice becomes muffled.   Return with high persistent fever, persistent vomiting, or if you have trouble breathing.   Please return if you have any other emergent concerns.  Additional Information:  Your vital signs today were: BP 127/81 mmHg   Pulse 92   Temp(Src) 98.4 F (36.9 C) (Oral)   Resp 18   Ht 5\' 3"  (1.6 m)   Wt 138 lb (62.596 kg)   BMI 24.45 kg/m2   SpO2 100%   LMP 05/12/2014 If your blood pressure (BP) was elevated above 135/85 this visit, please have this repeated by your doctor within one month. --------------

## 2014-05-26 NOTE — ED Notes (Signed)
Pt reports a sore throat for one week, pt reports the back of her throat is swollen and it is hard to swallow and that it is painful to talk, denies SOB. Pt denies fever.

## 2014-05-28 LAB — CULTURE, GROUP A STREP: STREP A CULTURE: NEGATIVE

## 2014-09-19 ENCOUNTER — Emergency Department (HOSPITAL_COMMUNITY)
Admission: EM | Admit: 2014-09-19 | Discharge: 2014-09-19 | Disposition: A | Discharge status: Home / Self Care | Payer: Self-pay | Attending: Emergency Medicine | Admitting: Emergency Medicine

## 2014-09-19 ENCOUNTER — Encounter (HOSPITAL_COMMUNITY): Payer: Self-pay | Admitting: *Deleted

## 2014-09-19 ENCOUNTER — Emergency Department (HOSPITAL_COMMUNITY): Payer: Self-pay

## 2014-09-19 DIAGNOSIS — O209 Hemorrhage in early pregnancy, unspecified: Secondary | ICD-10-CM | POA: Insufficient documentation

## 2014-09-19 DIAGNOSIS — Z3A01 Less than 8 weeks gestation of pregnancy: Secondary | ICD-10-CM | POA: Insufficient documentation

## 2014-09-19 LAB — HCG, QUANTITATIVE, PREGNANCY: HCG, BETA CHAIN, QUANT, S: 1111 m[IU]/mL — AB (ref <5)

## 2014-09-19 LAB — POC URINE PREG, ED: PREG TEST UR: POSITIVE — AB

## 2014-09-19 NOTE — Discharge Instructions (Signed)

## 2014-09-19 NOTE — ED Notes (Signed)
Pt returned from US

## 2014-09-19 NOTE — ED Notes (Signed)
Pt in from home c/o vaginal bleeding onset today, LMP June 24th, denies pain, confirmed pregnancy test, A&O x 4

## 2014-09-19 NOTE — ED Notes (Addendum)
Pt hx of miscarriage 12/10/13, pt states, "I miscarried due to low progesterone levels. I was taking the progesterone suppositories but my numbers were not improving so my doctor told me to stop.  When they told me I was pregnant I started taking them again. I took my last one last night."

## 2014-09-19 NOTE — ED Provider Notes (Signed)
History   Chief Complaint  Patient presents with  . Vaginal Bleeding  . Threatened Miscarriage    HPI  39 year old female G6 P for presents to ED [redacted] weeks pregnant with vaginal bleeding. Patient denies abdominal pain, pelvic pain, fevers, chills, urinary symptoms. Patient reports bleeding began early this morning and is similar to menstrual period bleeding. Patient reports for the past 5 days she's been taking left over progesterone pills that she had from her prior pregnancy. Patient states she took these when she found out she was pregnant. Last menstrual period was 6/24. Patient has not had an IUP confirmed by ultrasound yet and has not established OB/GYN yet as well. Patient started the medication on her own without doctor's advice. Patient denies any passage of anything other than blood. No other complaints. Past medical/surgical history, social history, medications, allergies and FH have been reviewed with patient and/or in documentation. Furthermore, if pt family or friend(s) present, additional historical information was obtained from them.  History reviewed. No pertinent past medical history. History reviewed. No pertinent past surgical history. No family history on file. History  Substance Use Topics  . Smoking status: Never Smoker   . Smokeless tobacco: Not on file  . Alcohol Use: No     Review of Systems Constitutional: - F/C, -fatigue.  HENT: - congestion, -rhinorrhea, -sore throat.   Eyes: - eye pain, -visual disturbance.  Respiratory: - cough, -SOB, -hemoptysis.   Cardiovascular: - CP, -palps.  Gastrointestinal: - N/V/D, -abd pain  Genitourinary: - flank pain, -dysuria, -frequency.  positive vaginal bleeding Musculoskeletal: - myalgia/arthritis, -joint swelling, -gait abnormality, -back pain, -neck pain/stiffness, -leg pain/swelling.  Skin: - rash/lesion.  Neurological: - focal weakness, -lightheadedness, -dizziness, -numbness, -HA.  All other systems reviewed and  are negative.   Physical Exam  Physical Exam  ED Triage Vitals  Enc Vitals Group     BP 09/19/14 0808 128/81 mmHg     Pulse Rate 09/19/14 0808 84     Resp 09/19/14 0808 18     Temp 09/19/14 0808 97.9 F (36.6 C)     Temp Source 09/19/14 0808 Oral     SpO2 09/19/14 0808 100 %     Weight 09/19/14 0808 136 lb (61.689 kg)     Height 09/19/14 0808  (1.575 m)     Head Cir --      Peak Flow --      Pain Score --      Pain Loc --      Pain Edu? --      Excl. in GC? --    Constitutional: Patient is well appearing and in no acute distress Head: Normocephalic and atraumatic.  Eyes: Extraocular motion intact, no scleral icterus Mouth: MMM, OP clear Neck: Supple without meningismus, mass, or overt JVD Respiratory: No respiratory distress. Normal WOB. No w/r/g. CV: RRR, no obvious murmurs.  Pulses +2 and symmetric. Euvolemic Abdomen: Soft, NT, ND, no r/g. No mass.  MSK: Extremities are atraumatic without deformity, ROM intact Skin: Warm, dry, intact without rash Neuro: AAOx4, MAE 5/5 sym, no focal deficit noted   ED Course  Procedures   Labs Reviewed  HCG, QUANTITATIVE, PREGNANCY - Abnormal; Notable for the following:    hCG, Beta Chain, Quant, S 1111 (*)    All other components within normal limits  POC URINE PREG, ED - Abnormal; Notable for the following:    Preg Test, Ur POSITIVE (*)    All other components within normal limits  I personally reviewed and interpreted all labs.  US Ob Comp Less 14 Wks  09/19/2014   CLINICAL DATA:  Vaginal bleeding  EXAM: OBSTETRIC <14 WK [place] AND TRANSVAGINAL OB US  TECHNIQUE: Both transabdominal and transvaginal ultrasound examinations were performed for complete evaluation of the gestation as well as the maternal uterus, adnexal regions, and pelvic cul-de-sac. Transvaginal technique was performed to assess early pregnancy.  COMPARISON:  None.  FINDINGS: Intrauterine gestational sac: Not visualized  Yolk sac:  Not visualized  Embryo:  Not  visualized  Cardiac Activity: Not visualized  Maternal uterus/adnexae: Uterus measures 8.4 x 4.8 x 6.0 cm size. Endometrium measures 12 mm in thickness with a smooth contour. No intrauterine mass. Right ovary measures 3.3 x 2.7 x 1.9 cm. There is a probable hemorrhagic corpus luteum in the right ovary measuring 1.4 x 1.0 cm. The left ovary measures 2.8 x 1.6 x 2.2 cm. No free pelvic fluid.  IMPRESSION: No intrauterine mass. No intrauterine gestation. Probable hemorrhagic corpus luteum. Beta HCG measurement reportedly in progress. If pregnancy test is found to be positive, differential considerations for the findings above would include intrauterine gestation too early to be seen by either transabdominal or transvaginal technique; recent spontaneous abortion ; possible ectopic gestation. Timing of repeat ultrasound will in part depend on clinical findings and serial beta HCG evaluations.   Electronically Signed   By: Bretta Bang III M.D.   On: 09/19/2014 10:25   US Ob Transvaginal  09/19/2014   CLINICAL DATA:  Vaginal bleeding  EXAM: OBSTETRIC <14 WK [place] AND TRANSVAGINAL OB US  TECHNIQUE: Both transabdominal and transvaginal ultrasound examinations were performed for complete evaluation of the gestation as well as the maternal uterus, adnexal regions, and pelvic cul-de-sac. Transvaginal technique was performed to assess early pregnancy.  COMPARISON:  None.  FINDINGS: Intrauterine gestational sac: Not visualized  Yolk sac:  Not visualized  Embryo:  Not visualized  Cardiac Activity: Not visualized  Maternal uterus/adnexae: Uterus measures 8.4 x 4.8 x 6.0 cm size. Endometrium measures 12 mm in thickness with a smooth contour. No intrauterine mass. Right ovary measures 3.3 x 2.7 x 1.9 cm. There is a probable hemorrhagic corpus luteum in the right ovary measuring 1.4 x 1.0 cm. The left ovary measures 2.8 x 1.6 x 2.2 cm. No free pelvic fluid.  IMPRESSION: No intrauterine mass. No intrauterine gestation. Probable  hemorrhagic corpus luteum. Beta HCG measurement reportedly in progress. If pregnancy test is found to be positive, differential considerations for the findings above would include intrauterine gestation too early to be seen by either transabdominal or transvaginal technique; recent spontaneous abortion ; possible ectopic gestation. Timing of repeat ultrasound will in part depend on clinical findings and serial beta HCG evaluations.   Electronically Signed   By: Bretta Bang III M.D.   On: 09/19/2014 10:25   I personally viewed above image(s) which were used in my medical decision making. Formal interpretations by Radiology.   EKG Interpretation  Date/Time:    Ventricular Rate:    PR Interval:    QRS Duration:   QT Interval:    QTC Calculation:   R Axis:     Text Interpretation:         EMERGENCY DEPARTMENT [place] PREGNANCY "Study: Limited Ultrasound of the Pelvis"  INDICATIONS:Pregnancy(required) and Vaginal bleeding Multiple views of the uterus and pelvic cavity are obtained with a multi-frequency probe.  APPROACH:Transabdominal   PERFORMED BY: Myself  IMAGES ARCHIVED?: Yes  LIMITATIONS: Body habitus and Decompressed bladder  PREGNANCY FREE FLUID: None  PREGNANCY UTERUS FINDINGS:Uterus normal size ADNEXAL FINDINGS: normal appearing ovaries bilaterally  PREGNANCY FINDINGS: No yolk sac noted and No fetal pole seen  INTERPRETATION: No visualized intrauterine pregnancy   Emergency Ultrasound Study:   Angiocath insertion Performed by: Ames Dura  Consent: Verbal consent obtained. Risks and benefits: risks, benefits and alternatives were discussed Immediately prior to procedure the correct patient, procedure, equipment, support staff and site/side marked as needed.  Indication: difficult IV access Preparation: Patient was prepped and draped in the usual sterile fashion. Vein Location: L brachial vein was visualized during assessment for potential access sites and  was found to be patent/ easily compressed with linear ultrasound.  The needle was visualized with real-time ultrasound and guided into the vein. Gauge: 20 g  Image saved and stored.  Normal blood return.  Patient tolerance: Patient tolerated the procedure well with no immediate complications.   MDM: SHAVAWN STOBAUGH is a 39 y.o. female with H&P as above who p/w CC: VB in early preg  Patient arrives with threatened AB. Patient is not having any abdominal pain and is hemodynamically stable and in no apparent distress. Benign abdominal exam. Bedside ultrasound performed and does not show obvious IUP. UPT was pregnant. Quant sent. Patient with a formal ultrasound.  HCG 1100. US shows Probable hemorrhagic corpus luteum. Pt is stable for outpt mgmt with f/u at [facility] in 2 days for quant.  Old records reviewed (if available). Labs and imaging reviewed personally by myself and considered in medical decision making if ordered. Clinical Impression: 1. Vaginal bleeding in pregnancy, first trimester     Disposition: Discharge  Condition: Good  I have discussed the results, Dx and Tx plan with the pt(& family if present). He/she/they expressed understanding and agree(s) with the plan. Discharge instructions discussed at great length. Strict return precautions discussed and pt &/or family have verbalized understanding of the instructions. No further questions at time of discharge.    New Prescriptions   No medications on file    Follow Up: [facility] [address] [place] [address] [phone] Go in 2 days For recheck of your [facility]  [facility] EMERGENCY DEPARTMENT [address] [id number] Wilhemina Bonito [place] [address] [phone]  If symptoms worsen   Pt seen in conjunction with Dr. Mirian Mo, MD  Ames Dura, DO [facility] Emergency Medicine Resident - PGY-3     Ames Dura, MD 09/19/14  1036  Mirian Mo, MD 09/19/14 1049

## 2014-09-22 ENCOUNTER — Encounter (HOSPITAL_COMMUNITY): Payer: Self-pay

## 2014-09-22 ENCOUNTER — Inpatient Hospital Stay (HOSPITAL_COMMUNITY)
Admission: AD | Admit: 2014-09-22 | Discharge: 2014-09-22 | Disposition: A | Discharge status: Home / Self Care | Payer: Self-pay | Source: Ambulatory Visit | Attending: Family Medicine | Admitting: Family Medicine

## 2014-09-22 ENCOUNTER — Inpatient Hospital Stay (HOSPITAL_COMMUNITY): Payer: Self-pay

## 2014-09-22 DIAGNOSIS — Z3A01 Less than 8 weeks gestation of pregnancy: Secondary | ICD-10-CM | POA: Insufficient documentation

## 2014-09-22 DIAGNOSIS — O3680X0 Pregnancy with inconclusive fetal viability, not applicable or unspecified: Secondary | ICD-10-CM

## 2014-09-22 DIAGNOSIS — O209 Hemorrhage in early pregnancy, unspecified: Secondary | ICD-10-CM | POA: Insufficient documentation

## 2014-09-22 DIAGNOSIS — O4691 Antepartum hemorrhage, unspecified, first trimester: Secondary | ICD-10-CM

## 2014-09-22 LAB — HCG, QUANTITATIVE, PREGNANCY: hCG, Beta Chain, Quant, S: 1213 m[IU]/mL — ABNORMAL HIGH (ref <5)

## 2014-09-22 NOTE — MAU Provider Note (Signed)
S: 39 y.o. G1P0  by LMP presents to MAU for repeat hcg.  She reports vaginal bleeding is light/moderate, like a period, and unchanged since her visit on 09/19/14 to the ED. She denies abdominal pain, vaginal itching/burning, urinary symptoms, h/a, dizziness, n/v, or fever/chills.    Her quant hcg on 09/19/14 was 1111 and ultrasound showed no IUP.  O: BP 124/68 mmHg  Pulse 78  Temp(Src) 98.2 F (36.8 C) (Oral)  Resp 16  LMP 08/08/2014  Breastfeeding? Unknown  Physical Examination: General appearance - alert, well appearing, and in no distress,  Neuro: oriented to person, place, and time and normal mood and affect Respiratory/cardiac: acyanotic, in no respiratory distress  Results for orders placed or performed during the hospital encounter of 09/22/14 (from the past 24 hour(s))  hCG, quantitative, pregnancy     Status: Abnormal   Collection Time: 09/22/14  9:52 AM  Result Value Ref Range   hCG, Beta Chain, Quant, S 1213 (H) <5 mIU/mL       A: 1. Pregnancy of unknown anatomic location   2. Vaginal bleeding in pregnancy, first trimester    Medical Mgmt  Reviewed repeat quant hcg today.  Ordered U/S based on results with inappropriate rise in hcg. Reviewed results of lab and imaging with pt.  Pt stable at this time without pain.  Vaginal bleeding continues without change.  Offered pt Methotrexate for possible ectopic pregnancy today per Dr Adrian Blackwater vs return in 48 hours for repeat quant hcg. Pt prefers to return for quant hcg as she suspects this is a miscarriage.  Pt stable at time of discharge.  P: Consult Dr Adrian Blackwater D/C home with ectopic/bleeding precautions Return to MAU in 48 hours for repeat quant hcg or sooner as needed with changes in pain/bleeding or onset of dizziness or n/v  LEFTWICH-KIRBY, Eldrige Pitkin, CNM 2:18 PM

## 2014-09-22 NOTE — MAU Note (Signed)
Pt here for repeat bhcg. Thinks she is miscarrying. Reports blood when wiping, otherwise scant amount. Has not passed any clots

## 2014-09-24 ENCOUNTER — Inpatient Hospital Stay (HOSPITAL_COMMUNITY)
Admission: AD | Admit: 2014-09-24 | Discharge: 2014-09-24 | Discharge status: Against Medical Advice | Payer: Self-pay | Source: Ambulatory Visit | Attending: Obstetrics & Gynecology | Admitting: Obstetrics & Gynecology

## 2014-09-24 DIAGNOSIS — O3680X0 Pregnancy with inconclusive fetal viability, not applicable or unspecified: Secondary | ICD-10-CM

## 2014-09-24 DIAGNOSIS — O0281 Inappropriate change in quantitative human chorionic gonadotropin (hCG) in early pregnancy: Secondary | ICD-10-CM | POA: Insufficient documentation

## 2014-09-24 LAB — HCG, QUANTITATIVE, PREGNANCY: hCG, Beta Chain, Quant, S: 1572 m[IU]/mL — ABNORMAL HIGH (ref <5)

## 2014-09-24 NOTE — MAU Provider Note (Signed)
History     CSN: [id number]  Arrival date and time: 09/24/14 0865   None     Chief Complaint  Patient presents with  . Follow-up   HPI [name] 39 y.o. G1P0  presents for followup quant.  She denies all abdominal/pelvic pain and reports her bleeding is diminished.  She declines all other symptoms.   OB History    Gravida Para Term Preterm AB TAB SAB Ectopic Multiple Living   1        1       No past medical history on file.  No past surgical history on file.  No family history on file.  Social History  Substance Use Topics  . Smoking status: Never Smoker   . Smokeless tobacco: Not on file  . Alcohol Use: No    Allergies: No Known Allergies  Prescriptions prior to admission  Medication Sig Dispense Refill Last Dose  . naproxen (NAPROSYN) 500 MG tablet Take 1 tablet (500 mg total) by mouth 2 (two) times daily. 20 tablet 0   . penicillin v potassium (VEETID) 500 MG tablet Take 500 mg by mouth 4 (four) times daily.   Past Week at Unknown time  . progesterone 200 MG SUPP Place 200 mg vaginally 2 (two) times daily.   09/18/2014 at Unknown time    ROS Pertinent ROS in HPI.  All other systems are negative.   Physical Exam   Last menstrual period 08/08/2014, unknown if currently breastfeeding.  Physical Exam  Constitutional: She is oriented to person, place, and time. She appears well-developed and well-nourished. No distress.  HENT:  Head: Normocephalic and atraumatic.  Eyes: EOM are normal.  Neck: Normal range of motion.  Respiratory: Effort normal. No respiratory distress.  Musculoskeletal: Normal range of motion.  Neurological: She is alert and oriented to person, place, and time.  Psychiatric: She has a normal mood and affect. Her behavior is normal.    MAU Course  Procedures  MDM Recent Results (from the past 2160 hour(s))  POC Urine Pregnancy, ED (do NOT order at [facility])     Status: Abnormal   Collection Time: 09/19/14  8:41 AM  Result  Value Ref Range   Preg Test, Ur POSITIVE (A) NEGATIVE    Comment:        THE SENSITIVITY OF THIS METHODOLOGY IS >24 mIU/mL   hCG, quantitative, pregnancy     Status: Abnormal   Collection Time: 09/19/14  9:32 AM  Result Value Ref Range   hCG, Beta Chain, Quant, S 1111 (H) <5 mIU/mL    Comment:          GEST. AGE      CONC.  (mIU/mL)   <=1 WEEK        5 - 50     2 WEEKS       50 - 500     3 WEEKS       100 - 10,000     4 WEEKS     1,000 - 30,000     5 WEEKS     3,500 - 115,000   6-8 WEEKS     12,000 - 270,000    12 WEEKS     15,000 - 220,000        FEMALE AND NON-PREGNANT FEMALE:     LESS THAN 5 mIU/mL   hCG, quantitative, pregnancy     Status: Abnormal   Collection Time: 09/22/14  9:52 AM  Result Value Ref Range  hCG, Beta Chain, Quant, S 1213 (H) <5 mIU/mL    Comment:          GEST. AGE      CONC.  (mIU/mL)   <=1 WEEK        5 - 50     2 WEEKS       50 - 500     3 WEEKS       100 - 10,000     4 WEEKS     1,000 - 30,000     5 WEEKS     3,500 - 115,000   6-8 WEEKS     12,000 - 270,000    12 WEEKS     15,000 - 220,000        FEMALE AND NON-PREGNANT FEMALE:     LESS THAN 5 mIU/mL   hCG, quantitative, pregnancy     Status: Abnormal   Collection Time: 09/24/14  9:00 AM  Result Value Ref Range   hCG, Beta Chain, Quant, S 1572 (H) <5 mIU/mL    Comment:          GEST. AGE      CONC.  (mIU/mL)   <=1 WEEK        5 - 50     2 WEEKS       50 - 500     3 WEEKS       100 - 10,000     4 WEEKS     1,000 - 30,000     5 WEEKS     3,500 - 115,000   6-8 WEEKS     12,000 - 270,000    12 WEEKS     15,000 - 220,000        FEMALE AND NON-PREGNANT FEMALE:     LESS THAN 5 mIU/mL   Discussed with Dr. Erin Fulling whom advises for Methotrexate for patient as her change in HCG is not consistent with a normally developing pregnancy and may indicate an ectopic pregnancy.  On consultation with pt, she states she has religious objection to using this medication.  She is in no pain and refuses  methotrexate.  She is advised that failure to use medication could result in a ruptured ectopic pregnancy which would require emergent surgery.  Risks include infertility and even death.  Pt states she is willing to accept these risks.    Assessment and Plan  A: Unknown anatomic location of pregnancy, Inappropriate rise in HCG  P: Discharge to home AGAINST MEDICAL ADVISE as pt refusing MTX Ectopic precautions emphasized.  Return to MAU at first signs of problem Return to MAU in 48 hours to repeat quant   Bertram Denver 09/24/2014, 10:29 AM

## 2014-10-03 ENCOUNTER — Inpatient Hospital Stay (HOSPITAL_COMMUNITY): Payer: Self-pay

## 2014-10-03 ENCOUNTER — Inpatient Hospital Stay (HOSPITAL_COMMUNITY)
Admission: AD | Admit: 2014-10-03 | Discharge: 2014-10-03 | Disposition: A | Discharge status: Home / Self Care | Payer: Self-pay | Source: Ambulatory Visit | Attending: Family Medicine | Admitting: Family Medicine

## 2014-10-03 ENCOUNTER — Encounter (HOSPITAL_COMMUNITY): Payer: Self-pay | Admitting: Student

## 2014-10-03 DIAGNOSIS — O009 Unspecified ectopic pregnancy without intrauterine pregnancy: Secondary | ICD-10-CM

## 2014-10-03 LAB — CBC
HCT: 35.2 % — ABNORMAL LOW (ref 36.0–46.0)
Hemoglobin: 11.6 g/dL — ABNORMAL LOW (ref 12.0–15.0)
MCH: 30.4 pg (ref 26.0–34.0)
MCHC: 33 g/dL (ref 30.0–36.0)
MCV: 92.4 fL (ref 78.0–100.0)
PLATELETS: 258 10*3/uL (ref 150–400)
RBC: 3.81 MIL/uL — AB (ref 3.87–5.11)
RDW: 13.5 % (ref 11.5–15.5)
WBC: 7 10*3/uL (ref 4.0–10.5)

## 2014-10-03 LAB — COMPREHENSIVE METABOLIC PANEL
ALBUMIN: 4.3 g/dL (ref 3.5–5.0)
ALT: 10 U/L — AB (ref 14–54)
AST: 17 U/L (ref 15–41)
Alkaline Phosphatase: 39 U/L (ref 38–126)
Anion gap: 8 (ref 5–15)
BUN: 17 mg/dL (ref 6–20)
CHLORIDE: 105 mmol/L (ref 101–111)
CO2: 22 mmol/L (ref 22–32)
CREATININE: 0.89 mg/dL (ref 0.44–1.00)
Calcium: 9.3 mg/dL (ref 8.9–10.3)
GFR calc Af Amer: >60 mL/min (ref >60)
GLUCOSE: 105 mg/dL — AB (ref 65–99)
Potassium: 3.5 mmol/L (ref 3.5–5.1)
SODIUM: 135 mmol/L (ref 135–145)
Total Bilirubin: 0.5 mg/dL (ref 0.3–1.2)
Total Protein: 7.8 g/dL (ref 6.5–8.1)

## 2014-10-03 LAB — HCG, QUANTITATIVE, PREGNANCY: HCG, BETA CHAIN, QUANT, S: 5635 m[IU]/mL — AB (ref <5)

## 2014-10-03 MED ORDER — METHOTREXATE INJECTION FOR WOMEN'S HOSPITAL
50.0000 mg/m2 | Freq: Once | INTRAMUSCULAR | Status: AC
  Administered 2014-10-03: 85 mg via INTRAMUSCULAR
  Filled 2014-10-03: qty 1.7

## 2014-10-03 NOTE — MAU Note (Signed)
Pt left immediately after receiving MTX due to having to get husband. Would not stay for observation after receiving med.  Understands to return Monday afternoon for repeat BHCG or earlier for severe abd pain or any other concerns. Pt agrees.

## 2014-10-03 NOTE — MAU Provider Note (Signed)
Patient now has a 1.4 cm pobable ectopic with BHCG of 5635 and inappropriately rising BHCG's.  She has refused MTX x 2.  I saw patient and discussed how waiting will likely result in emergency surgery and outcomes related to ruptured ectopic with loss of tube and fertility.  She has agreed to MTX, although somewhat reluctantly.

## 2014-10-03 NOTE — Discharge Instructions (Signed)
Methotrexate Treatment for an Ectopic Pregnancy, Care After °Refer to this sheet in the next few weeks. These instructions provide you with information on caring for yourself after your procedure. Your health care provider may also give you more specific instructions. Your treatment has been planned according to current medical practices, but problems sometimes occur. Call your health care provider if you have any problems or questions after your procedure. °WHAT TO EXPECT AFTER THE PROCEDURE °You may have some abdominal cramping, vaginal bleeding, and fatigue in the first few days after taking methotrexate. Some other possible side effects of methotrexate include: °· Nausea. °· Vomiting. °· Diarrhea. °· Mouth sores. °· Swelling or irritation of the lining of your lungs (pneumonitis). °· Liver damage. °· Hair loss. °HOME CARE INSTRUCTIONS  °After you have received the methotrexate medicine, you need to be careful of your activities and watch your condition for several weeks. It may take 1 week before your hormone levels return to normal. °· Keep all follow-up appointments as directed by your health care provider. °· Avoid traveling too far away from your health care provider. °· Do not have sexual intercourse until your health care provider says it is safe to do so. °· You may resume your usual diet. °· Limit strenuous activity. °· Do not take folic acid, prenatal vitamins, or other vitamins that contain folic acid. °· Do not take aspirin, ibuprofen, or naproxen (nonsteroidal anti-inflammatory drugs [NSAIDs]). °· Do not drink alcohol. °SEEK MEDICAL CARE IF:  °· You cannot control your nausea and vomiting. °· You cannot control your diarrhea. °· You have sores in your mouth and want treatment. °· You need pain medicine for your abdominal pain. °· You have a rash. °· You are having a reaction to the medicine. °SEEK IMMEDIATE MEDICAL CARE IF:  °· You have increasing abdominal or pelvic pain. °· You notice increased  bleeding. °· You feel light-headed, or you faint. °· You have shortness of breath. °· Your heart rate increases. °· You have a cough. °· You have chills. °· You have a fever. °Document Released: 01/20/2011 Document Revised: 02/05/2013 Document Reviewed: 11/19/2012 °ExitCare® Patient Information ©2015 [employer]. This information is not intended to replace advice given to you by your health care provider. Make sure you discuss any questions you have with your health care provider. ° °Ectopic Pregnancy °An ectopic pregnancy happens when a fertilized egg grows outside the uterus. A pregnancy cannot live outside of the uterus. This problem often happens in the fallopian tube. It is often caused by damage to the fallopian tube. °If this problem is found early, you may be treated with medicine. If your tube tears or bursts open (ruptures), you will bleed inside. This is an emergency. You will need surgery. Get help right away.  °SYMPTOMS °You may have normal pregnancy symptoms at first. These include: °· Missing your period. °· Feeling sick to your stomach (nauseous). °· Being tired. °· Having tender breasts. °Then, you may start to have symptoms that are not normal. These include: °· Pain with sex (intercourse). °· Bleeding from the vagina. This includes light bleeding (spotting). °· Belly (abdomen) or lower belly cramping or pain. This may be felt on one side. °· A fast heartbeat (pulse). °· Passing out (fainting) after going poop (bowel movement). °If your tube tears, you may have symptoms such as: °· Really bad pain in the belly or lower belly. This happens suddenly. °· Dizziness. °· Passing out. °· Shoulder pain. °GET HELP RIGHT AWAY IF:  °  You have any of these symptoms. This is an emergency. °MAKE SURE YOU: °· Understand these instructions. °· Will watch your condition. °· Will get help right away if you are not doing well or get worse. °Document Released: 04/29/2008 Document Revised: 02/05/2013 Document  Reviewed: 09/12/2012 °ExitCare® Patient Information ©2015 [employer]. This information is not intended to replace advice given to you by your health care provider. Make sure you discuss any questions you have with your health care provider. ° °

## 2014-10-03 NOTE — MAU Note (Signed)
Pt states here for f/u of hormone level. Was to return 09/26/2014 for repeat BHCG however wanted to see what would happen. Denies bleeding or pain.

## 2014-10-03 NOTE — MAU Provider Note (Signed)
[name]  is a 39 y.o. G1P0 at [redacted]w[redacted]d who presents to MAU today for follow-up quant HCG. Was previously seen on 8/5, 8/8, & 8/10 with HCG of 1111, 1213, & 1572. Ultrasounds done on 8/8 & 8/10 showed no IUP. Recommendation was made to patient on 8/10 for MTX. Pt refused MTX & left AMA. Patient returns to MAU today to recheck the hormone level. Denies vaginal bleeding or abdominal pain.   BP 124/64 mmHg  Pulse 90  Temp(Src) 97.5 F (36.4 C) (Oral)  Resp 18  LMP 08/08/2014  Breastfeeding? No  CONSTITUTIONAL: Well-developed, well-nourished female in no acute distress.  ENT: External right and left ear normal.  EYES: EOM intact, conjunctivae normal.  MUSCULOSKELETAL: Normal range of motion.  CARDIOVASCULAR: Regular heart rate RESPIRATORY: Normal effort NEUROLOGICAL: Alert and oriented to person, place, and time.  SKIN: Skin is warm and dry. No rash noted. Not diaphoretic. No erythema. No pallor. PSYCH: Normal mood and affect. Normal behavior. Normal judgment and thought content.   A: Results for [name], [name] (MRN [id number]) as of 10/03/2014 17:44  Ref. Range 09/19/2014 09:32 09/22/2014 09:52 09/24/2014 09:00 10/03/2014 16:10  HCG, Beta Chain, Quant, S Latest Ref Range: <5 mIU/mL 1111 (H) 1213 (H) 1572 (H) 5635 (H)   Inappropriate rise in HCG.  1734- C/w Dr. Shawnie Pons who recommended ultrasound at this time.  1830- Radiologist called - 1.4 cm right adnexal mass suspicious for ectopic, no free fluid 1835- C/w Dr. Shawnie Pons. Ok for MTX.   P: Pt initially agreeable to MTX; now states she wants to leave & come back tomorrow.  Dr. Shawnie Pons called to come speak with patient MTX given Discharge home with ectopic precautions Return on Monday 8/22 for day 4 labs   Judeth Horn, NP 10/03/2014 5:41 PM

## 2014-10-07 ENCOUNTER — Inpatient Hospital Stay (HOSPITAL_COMMUNITY)
Admission: AD | Admit: 2014-10-07 | Discharge: 2014-10-07 | Disposition: A | Discharge status: Home / Self Care | Payer: Self-pay | Source: Ambulatory Visit | Attending: Obstetrics & Gynecology | Admitting: Obstetrics & Gynecology

## 2014-10-07 DIAGNOSIS — O009 Unspecified ectopic pregnancy without intrauterine pregnancy: Secondary | ICD-10-CM

## 2014-10-07 LAB — HCG, QUANTITATIVE, PREGNANCY: hCG, Beta Chain, Quant, S: 7101 m[IU]/mL — ABNORMAL HIGH (ref <5)

## 2014-10-07 NOTE — MAU Provider Note (Signed)
S: 39 y.o. [id number]  by LMP presents to MAU for repeat hcg.  Yesterday was Day 4 following MTX therapy for ectopic pregnancy but she came today instead of yesterday.  She denies abdominal pain or vaginal bleeding today.    Was previously seen on 8/5, 8/8, & 8/10 with HCG of 1111, 1213, & 1572. Ultrasounds done on 8/8 and 8/10 showed no IUP. Pt reluctant to take MTX and opted to repeat quant another time.  On 8/19 quant hcg was 5635 and [place] indicates 1.4 cm vascular right adnexal mass adjacent to the right ovary suspicious for ectopic pregnancy.  Pt started MTX therapy on 8/19.  O: BP 109/69 mmHg  Pulse 83  Temp(Src) 98.6 F (37 C) (Oral)  Resp 20  SpO2 100%  LMP 08/08/2014  Physical Examination: General appearance - alert, well appearing, and in no distress, oriented to person, place, and time and acyanotic, in no respiratory distress  Results for orders placed or performed during the hospital encounter of 10/07/14 (from the past 24 hour(s))  hCG, quantitative, pregnancy     Status: Abnormal   Collection Time: 10/07/14  4:40 PM  Result Value Ref Range   hCG, Beta Chain, Quant, S 7101 (H) <5 mIU/mL       A: 1. Ectopic pregnancy     P: Consult Dr Debroah Loop to discuss lab results on Day 5  D/C home with ectopic precautions F/U on Friday for Day 7 labs Return to MAU as needed for emergencies  LEFTWICH-KIRBY, Nataly Pacifico, CNM 2:18 PM

## 2014-10-07 NOTE — Discharge Instructions (Signed)
Methotrexate Treatment for an Ectopic Pregnancy, Care After ° °Refer to this sheet in the next few weeks. These instructions provide you with information on caring for yourself after your procedure. Your health care provider may also give you more specific instructions. Your treatment has been planned according to current medical practices, but problems sometimes occur. Call your health care provider if you have any problems or questions after your procedure. ° °WHAT TO EXPECT AFTER THE PROCEDURE °You may have some abdominal cramping, vaginal bleeding, and fatigue in the first few days after taking methotrexate. Some other possible side effects of methotrexate include: °· Nausea. °· Vomiting. °· Diarrhea. °· Mouth sores. °· Swelling or irritation of the lining of your lungs (pneumonitis). °· Liver damage. °· Hair loss. °·  °HOME CARE INSTRUCTIONS  °After you have received the methotrexate medicine, you need to be careful of your activities and watch your condition for several weeks. It may take 1 week before your hormone levels return to normal. °· Keep all follow-up appointments as directed by your health care provider. °· Avoid traveling too far away from your health care provider. °· Do not have sexual intercourse until your health care provider says it is safe to do so. °· You may resume your usual diet. °· Limit strenuous activity. °· Do not take folic acid, prenatal vitamins, or other vitamins that contain folic acid. °· Do not take aspirin, ibuprofen, or naproxen (nonsteroidal anti-inflammatory drugs [NSAIDs]). °· Do not drink alcohol. ° °SEEK MEDICAL CARE IF:  °· You cannot control your nausea and vomiting. °· You cannot control your diarrhea. °· You have sores in your mouth and want treatment. °· You need pain medicine for your abdominal pain. °· You have a rash. °· You are having a reaction to the medicine. °SEEK IMMEDIATE MEDICAL CARE IF:  °· You have increasing abdominal or pelvic pain. °· You notice  increased bleeding. °· You feel light-headed, or you faint. °· You have shortness of breath. °· Your heart rate increases. °· You have a cough. °· You have chills. °· You have a fever. °Document Released: 01/20/2011 Document Revised: 02/05/2013 Document Reviewed: 11/19/2012 °ExitCare® Patient Information ©2015 [employer]. This information is not intended to replace advice given to you by your health care provider. Make sure you discuss any questions you have with your health care provider. ° °Ectopic Pregnancy °An ectopic pregnancy is when the fertilized egg attaches (implants) outside the uterus. Most ectopic pregnancies occur in the fallopian tube. Rarely do ectopic pregnancies occur on the ovary, intestine, pelvis, or cervix. In an ectopic pregnancy, the fertilized egg does not have the ability to develop into a normal, healthy baby.  °A ruptured ectopic pregnancy is one in which the fallopian tube gets torn or bursts and results in internal bleeding. Often there is intense abdominal pain, and sometimes, vaginal bleeding. Having an ectopic pregnancy can be life threatening. If left untreated, this dangerous condition can lead to a blood transfusion, abdominal surgery, or even death. °CAUSES  °Damage to the fallopian tubes is the suspected cause in most ectopic pregnancies.  °RISK FACTORS °Depending on your circumstances, the risk of having an ectopic pregnancy will vary. The level of risk can be divided into three categories. °High Risk °· You have gone through infertility treatment. °· You have had a previous ectopic pregnancy. °· You have had previous tubal surgery. °· You have had previous surgery to have the fallopian tubes tied (tubal ligation). °· You have tubal problems or diseases. °·   You have been exposed to DES. DES is a medicine that was used until 1971 and had effects on babies whose mothers took the medicine. °· You become pregnant while using an intrauterine device (IUD) for birth  control.  °Moderate Risk °· You have a history of infertility. °· You have a history of a sexually transmitted infection (STI). °· You have a history of pelvic inflammatory disease (PID). °· You have scarring from endometriosis. °· You have multiple sexual partners. °· You smoke.  °Low Risk °· You have had previous pelvic surgery. °· You use vaginal douching. °· You became sexually active before 38 years of age. °SIGNS AND SYMPTOMS  °An ectopic pregnancy should be suspected in anyone who has missed a period and has abdominal pain or bleeding. °· You may experience normal pregnancy symptoms, such as: °¨ Nausea. °¨ Tiredness. °¨ Breast tenderness. °· Other symptoms may include: °¨ Pain with intercourse. °¨ Irregular vaginal bleeding or spotting. °¨ Cramping or pain on one side or in the lower abdomen. °¨ Fast heartbeat. °¨ Passing out while having a bowel movement. °· Symptoms of a ruptured ectopic pregnancy and internal bleeding may include: °¨ Sudden, severe pain in the abdomen and pelvis. °¨ Dizziness or fainting. °¨ Pain in the shoulder area. °DIAGNOSIS  °Tests that may be performed include: °· A pregnancy test. °· An ultrasound test. °· Testing the specific level of pregnancy hormone in the bloodstream. °· Taking a sample of uterus tissue (dilation and curettage, D&C). °· Surgery to perform a visual exam of the inside of the abdomen using a thin, lighted tube with a tiny camera on the end (laparoscope). °TREATMENT  °An injection of a medicine called methotrexate may be given. This medicine causes the pregnancy tissue to be absorbed. It is given if: °· The diagnosis is made early. °· The fallopian tube has not ruptured. °· You are considered to be a good candidate for the medicine. °Usually, pregnancy hormone blood levels are checked after methotrexate treatment. This is to be sure the medicine is effective. It may take 4-6 weeks for the pregnancy to be absorbed (though most pregnancies will be absorbed by 3  weeks). °Surgical treatment may be needed. A laparoscope may be used to remove the pregnancy tissue. If severe internal bleeding occurs, a cut (incision) may be made in the lower abdomen (laparotomy), and the ectopic pregnancy is removed. This stops the bleeding. Part of the fallopian tube, or the whole tube, may be removed as well (salpingectomy). After surgery, pregnancy hormone tests may be done to be sure there is no pregnancy tissue left. You may receive a Rho (D) immune globulin shot if you are Rh negative and the father is Rh positive, or if you do not know the Rh type of the father. This is to prevent problems with any future pregnancy. °SEEK IMMEDIATE MEDICAL CARE IF:  °You have any symptoms of an ectopic pregnancy. This is a medical emergency. °MAKE SURE YOU: °· Understand these instructions. °· Will watch your condition. °· Will get help right away if you are not doing well or get worse. °Document Released: 03/10/2004 Document Revised: 06/17/2013 Document Reviewed: 08/30/2012 °ExitCare® Patient Information ©2015 [employer]. This information is not intended to replace advice given to you by your health care provider. Make sure you discuss any questions you have with your health care provider. ° °

## 2014-10-07 NOTE — MAU Note (Signed)
Pt here for F/U BHCG, pt denies pain or bleeding. 

## 2014-10-11 ENCOUNTER — Inpatient Hospital Stay (HOSPITAL_COMMUNITY)
Admission: AD | Admit: 2014-10-11 | Discharge: 2014-10-11 | Disposition: A | Discharge status: Home / Self Care | Payer: Self-pay | Source: Ambulatory Visit | Attending: Obstetrics and Gynecology | Admitting: Obstetrics and Gynecology

## 2014-10-11 ENCOUNTER — Encounter (HOSPITAL_COMMUNITY): Payer: Self-pay

## 2014-10-11 DIAGNOSIS — O009 Unspecified ectopic pregnancy without intrauterine pregnancy: Secondary | ICD-10-CM

## 2014-10-11 LAB — HCG, QUANTITATIVE, PREGNANCY: HCG, BETA CHAIN, QUANT, S: 5291 m[IU]/mL — AB (ref <5)

## 2014-10-11 NOTE — MAU Provider Note (Signed)
Chief Complaint  Patient presents with  . Repeat BHCG post MTX     Subjective:   Pt is a 39 y.o. [id number] here for follow-up BHCG.  Upon review of the records patient received methotrexated for an ectopic pregnancy on 10-03-14.  Pt here today with no report of abdominal pain or vaginal bleeding.   Feels fine.  No complaints.  All other systems negative.  Last seen in MAU on 10-07-14.      BHCG was 7101 on Day 5 after methotrexate.    Past Medical History  Diagnosis Date  . Medical history non-contributory     OB History  Gravida Para Term Preterm AB SAB TAB Ectopic Multiple Living  # Outcome Date GA Lbr Len/2nd Weight Sex Delivery Anes PTL Lv  6A Gravida           6B Current           5 SAB           4 Term           3 Term           2 Term           1 Term               No family history on file.  Objective: Physical Exam  Filed Vitals:   10/11/14 0928  BP: 103/68  Pulse: 99  Temp: 97.6 F (36.4 C)  Resp: 16   Constitutional: She is oriented to person, place, and time. She appears well-developed and well-nourished. No distress.  Pulmonary/Chest: Effort normal. No respiratory distress.  Musculoskeletal: Normal range of motion.  Neurological: She is alert and oriented to person, place, and time.  Skin: Skin is warm and dry.    Assessment: 39 y.o. [id number] Follow-up BHCG after methotreate Appropriately falling quants  Plan: 25% drop in quant from day 6 to day 10.  (all labs not drawn on designated days). Will need weekly quants now with solid drop. Scheduled to follow up clinic on Friday, Sept. 2 (holiday on Sept. 5) for repeat quant.  Sticker put into clinic schedule book in MAU.

## 2014-10-11 NOTE — MAU Note (Signed)
Pt here for repeat BHCG, denies pain or bleeding.  

## 2014-10-17 ENCOUNTER — Other Ambulatory Visit: Payer: Self-pay

## 2014-10-17 ENCOUNTER — Telehealth: Payer: Self-pay | Admitting: *Deleted

## 2014-10-17 NOTE — Telephone Encounter (Signed)
Pt missed appointment for beta to followup ectopic. I called patient and left voicemail that she missed appointment and to call us back to reschedule or go to MAU.

## 2014-10-21 ENCOUNTER — Encounter: Payer: Self-pay | Admitting: *Deleted

## 2014-10-21 NOTE — Telephone Encounter (Signed)
Certified letter to patient regarding appointment.

## 2014-10-30 ENCOUNTER — Other Ambulatory Visit: Payer: Self-pay

## 2014-10-30 DIAGNOSIS — O209 Hemorrhage in early pregnancy, unspecified: Secondary | ICD-10-CM

## 2014-10-31 LAB — HCG, QUANTITATIVE, PREGNANCY: hCG, Beta Chain, Quant, S: 110.3 m[IU]/mL

## 2014-11-02 ENCOUNTER — Encounter (HOSPITAL_COMMUNITY): Payer: Self-pay | Admitting: *Deleted

## 2014-11-02 ENCOUNTER — Inpatient Hospital Stay (HOSPITAL_COMMUNITY)
Admission: AD | Admit: 2014-11-02 | Discharge: 2014-11-02 | Disposition: A | Discharge status: Home / Self Care | Payer: Self-pay | Source: Ambulatory Visit | Attending: Family Medicine | Admitting: Family Medicine

## 2014-11-02 ENCOUNTER — Inpatient Hospital Stay (HOSPITAL_COMMUNITY): Payer: Self-pay

## 2014-11-02 DIAGNOSIS — N8320 Unspecified ovarian cysts: Secondary | ICD-10-CM | POA: Insufficient documentation

## 2014-11-02 DIAGNOSIS — O009 Unspecified ectopic pregnancy without intrauterine pregnancy: Secondary | ICD-10-CM

## 2014-11-02 LAB — CBC
HCT: 33 % — ABNORMAL LOW (ref 36.0–46.0)
HEMOGLOBIN: 10.9 g/dL — AB (ref 12.0–15.0)
MCH: 30.7 pg (ref 26.0–34.0)
MCHC: 33 g/dL (ref 30.0–36.0)
MCV: 93 fL (ref 78.0–100.0)
PLATELETS: 211 10*3/uL (ref 150–400)
RBC: 3.55 MIL/uL — AB (ref 3.87–5.11)
RDW: 14.2 % (ref 11.5–15.5)
WBC: 7 10*3/uL (ref 4.0–10.5)

## 2014-11-02 LAB — COMPREHENSIVE METABOLIC PANEL
ALT: 10 U/L — AB (ref 14–54)
AST: 15 U/L (ref 15–41)
Albumin: 3.8 g/dL (ref 3.5–5.0)
Alkaline Phosphatase: 34 U/L — ABNORMAL LOW (ref 38–126)
Anion gap: 4 — ABNORMAL LOW (ref 5–15)
BUN: 16 mg/dL (ref 6–20)
CHLORIDE: 110 mmol/L (ref 101–111)
CO2: 25 mmol/L (ref 22–32)
Calcium: 9 mg/dL (ref 8.9–10.3)
Creatinine, Ser: 0.89 mg/dL (ref 0.44–1.00)
Glucose, Bld: 94 mg/dL (ref 65–99)
POTASSIUM: 3.8 mmol/L (ref 3.5–5.1)
SODIUM: 139 mmol/L (ref 135–145)
Total Bilirubin: 0.5 mg/dL (ref 0.3–1.2)
Total Protein: 7.2 g/dL (ref 6.5–8.1)

## 2014-11-02 LAB — URINALYSIS, ROUTINE W REFLEX MICROSCOPIC
BILIRUBIN URINE: NEGATIVE
Glucose, UA: NEGATIVE mg/dL
Hgb urine dipstick: NEGATIVE
KETONES UR: NEGATIVE mg/dL
LEUKOCYTES UA: NEGATIVE
NITRITE: NEGATIVE
PH: 6 (ref 5.0–8.0)
PROTEIN: NEGATIVE mg/dL
Specific Gravity, Urine: >1.03 — ABNORMAL HIGH (ref 1.005–1.030)
UROBILINOGEN UA: 1 mg/dL (ref 0.0–1.0)

## 2014-11-02 LAB — HCG, QUANTITATIVE, PREGNANCY: HCG, BETA CHAIN, QUANT, S: 82 m[IU]/mL — AB (ref <5)

## 2014-11-02 MED ORDER — METHOTREXATE INJECTION FOR WOMEN'S HOSPITAL
50.0000 mg/m2 | Freq: Once | INTRAMUSCULAR | Status: AC
  Administered 2014-11-02: 85 mg via INTRAMUSCULAR
  Filled 2014-11-02: qty 1.7

## 2014-11-02 NOTE — MAU Note (Signed)
Patient presents stating that she might be pregnant with c/o abdominal pain today and states that she rec'd an injection for an ectopic pregnancy X 1 month ago in MAU and returned last Wednesday to the clinic to have her b-hcg drawn but was not contacted on Friday with her results as promised. Denies bleeding but states she has a clear, mucus discharge.

## 2014-11-02 NOTE — MAU Provider Note (Signed)
Care assumed at 2010 from Telford Nab.    Pt assessment:  Pt reports pain is now a 0/10 from 9/10 at admission (no meds given); continued tenderness with palpation.    Ultrasound: CLINICAL DATA: Ectopic pregnancy.  Methotrexate on 10/03/14 for right ectopic pregnancy w/ appropriately falling quant, now with increased R adnexal pain and tenderness  EXAM: TRANSVAGINAL OB ULTRASOUND  TECHNIQUE: Transvaginal ultrasound was performed for complete evaluation of the gestation as well as the maternal uterus, adnexal regions, and pelvic cul-de-sac.  COMPARISON: 10/03/2014  FINDINGS: Intrauterine gestational sac: None  Uterus: No masses. Endometrium is thin. No significant endometrial fluid.  Ovaries and adnexa: 2.6 x 1.6 x 2.0 cm heterogeneous solid mass inferior and lateral to the right ovary consistent with ectopic pregnancy. This measures larger than on the prior study, but is less vascular.  Right ovary measures 2.4 x 1.4 x 1.4 cm. Mildly irregular 2.4 cm cyst is noted.  Left ovary shows a dominant cyst. Ovary measures 3.5 x 3 x 3.4 cm. Cyst measures 3.2 cm. No left adnexal mass.  Trace pelvic free fluid.  IMPRESSION: 1. Right adnexal heterogeneous solid mass consistent with an ectopic pregnancy is larger than measured previously, now 2.6 x 1.6 x 2.0 cm, but less vascular. There is no apparent gestational sac or embryo. 2. Mildly complicated right ovarian cyst measuring 2.4 cm. Larger more simple appearing left ovarian cyst measured 3.2 cm. 3. No other abnormalities. No intrauterine pregnancy.  2045 Consulted with Dr. Shawnie Pons > Reviewed HPI/Exam/labs/ultrasound > repeat MTX 2046 CMP reordered  CMP Latest Ref Rng 11/02/2014 10/03/2014 06/09/2008  Glucose 65 - 99 mg/dL 94 409(W) 94 REPEATED TO VERIFY  BUN 6 - 20 mg/dL Creatinine 0.44 - 1.00 mg/dL 1.19 1.47 8.29  Sodium 135 - 145 mmol/L 139 135 140  Potassium 3.5 - 5.1 mmol/L 3.8 3.5 3.8  Chloride 101  - 111 mmol/L 110 105 109  CO2 22 - 32 mmol/L Calcium 8.9 - 10.3 mg/dL 9.0 9.3 8.9  Total Protein 6.5 - 8.1 g/dL 7.2 7.8 6.0  Total Bilirubin 0.3 - 1.2 mg/dL 0.5 0.5 5.6(O)  Alkaline Phos 38 - 126 U/L 34(L) 39 163(H)  AST 15 - 41 U/L ALT 14 - 54 U/L 10(L) 10(L) 12    A:  Ectopic Pregnancy  P: Discharge to home Ectopic precautions Follow-up on Wednesday for repeat BHCG  Marlis Edelson, CNM

## 2014-11-02 NOTE — MAU Provider Note (Signed)
History     CSN: [id number]  Arrival date and time: 11/02/14 1833   None     Chief Complaint  Patient presents with  . Complications following an Ectopic Pregnancy   . Abdominal Pain   HPI 39 y.o. [id number] presenting w/ complaint of sudden onset of "intense" right pelvic pain starting a few hours ago, denies vaginal bleeding. Rates pain 6/10. Pt was treated w/ Methotrexate for right ectopic pregnancy on 10/03/14. Pt has not kept follow up appointments as recommended. Last seen in MAU for follow up quant on day 10 s/p MTX, 8/29, HCG was 5291 at that time. Did not keep 1 week follow up in GYN clinic for quant, but followed up on 9/14, HCG was 110 at that time.    History reviewed. No pertinent past medical history.  Past Surgical History  Procedure Laterality Date  . No past surgeries      History reviewed. No pertinent family history.  Social History  Substance Use Topics  . Smoking status: Never Smoker   . Smokeless tobacco: None  . Alcohol Use: No    Allergies: No Known Allergies  No prescriptions prior to admission    Review of Systems  Constitutional: Negative.  Negative for fever and chills.  Respiratory: Negative.   Cardiovascular: Negative.   Gastrointestinal: Positive for nausea and abdominal pain. Negative for vomiting, diarrhea and constipation.  Genitourinary: Negative for dysuria, urgency, frequency, hematuria and flank pain.       Negative for vaginal bleeding  Musculoskeletal: Negative.   Neurological: Negative.   Psychiatric/Behavioral: Negative.    Physical Exam   Blood pressure 133/77, pulse 77, temperature 98.1 F (36.7 C), temperature source Oral, resp. rate 16, height  (1.575 m), weight 146 lb (66.225 kg), unknown if currently breastfeeding.  Physical Exam  Nursing note and vitals reviewed. Constitutional: She is oriented to person, place, and time. She appears well-developed and well-nourished. No distress.  Cardiovascular: Normal  rate.   Respiratory: Effort normal.  GI: Soft. She exhibits no distension. There is tenderness (RLQ).  Genitourinary: There is no rash, tenderness or lesion on the right labia. There is no rash, tenderness or lesion on the left labia. Uterus is not enlarged and not tender. Cervix exhibits no motion tenderness. Right adnexum displays tenderness (R>L). Left adnexum displays tenderness (mild). No bleeding in the vagina.  Musculoskeletal: Normal range of motion.  Neurological: She is alert and oriented to person, place, and time.  Skin: Skin is warm and dry. No rash noted.  Psychiatric: She has a normal mood and affect.    MAU Course  Procedures  Results for orders placed or performed during the hospital encounter of 11/02/14 (from the past 24 hour(s))  Urinalysis, Routine w reflex microscopic (not at [facility])     Status: Abnormal   Collection Time: 11/02/14  6:38 PM  Result Value Ref Range   Color, Urine YELLOW YELLOW   APPearance CLEAR CLEAR   Specific Gravity, Urine >1.030 (H) 1.005 - 1.030   pH 6.0 5.0 - 8.0   Glucose, UA NEGATIVE NEGATIVE mg/dL   Hgb urine dipstick NEGATIVE NEGATIVE   Bilirubin Urine NEGATIVE NEGATIVE   Ketones, ur NEGATIVE NEGATIVE mg/dL   Protein, ur NEGATIVE NEGATIVE mg/dL   Urobilinogen, UA 1.0 0.0 - 1.0 mg/dL   Nitrite NEGATIVE NEGATIVE   Leukocytes, UA NEGATIVE NEGATIVE  CBC     Status: Abnormal   Collection Time: 11/02/14  7:05 PM  Result Value Ref Range   WBC  7.0 4.0 - 10.5 K/uL   RBC 3.55 (L) 3.87 - 5.11 MIL/uL   Hemoglobin 10.9 (L) 12.0 - 15.0 g/dL   HCT 09.8 (L) 11.9 - 14.7 %   MCV 93.0 78.0 - 100.0 fL   MCH 30.7 26.0 - 34.0 pg   MCHC 33.0 30.0 - 36.0 g/dL   RDW 82.9 56.2 - 13.0 %   Platelets 211 150 - 400 K/uL  hCG, quantitative, pregnancy     Status: Abnormal   Collection Time: 11/02/14  7:05 PM  Result Value Ref Range   hCG, Beta Chain, Quant, S 82 (H) <5 mIU/mL   Pt declines pain medication. U/S pending. Care assumed by Margarita Mail,  CNM  FRAZIER,NATALIE 8:05 PM, 11/02/2014    Assessment and Plan

## 2014-11-02 NOTE — Discharge Instructions (Signed)
Methotrexate Treatment for an Ectopic Pregnancy, Care After °Refer to this sheet in the next few weeks. These instructions provide you with information on caring for yourself after your procedure. Your health care provider may also give you more specific instructions. Your treatment has been planned according to current medical practices, but problems sometimes occur. Call your health care provider if you have any problems or questions after your procedure. °WHAT TO EXPECT AFTER THE PROCEDURE °You may have some abdominal cramping, vaginal bleeding, and fatigue in the first few days after taking methotrexate. Some other possible side effects of methotrexate include: °· Nausea. °· Vomiting. °· Diarrhea. °· Mouth sores. °· Swelling or irritation of the lining of your lungs (pneumonitis). °· Liver damage. °· Hair loss. °HOME CARE INSTRUCTIONS  °After you have received the methotrexate medicine, you need to be careful of your activities and watch your condition for several weeks. It may take 1 week before your hormone levels return to normal. °· Keep all follow-up appointments as directed by your health care provider. °· Avoid traveling too far away from your health care provider. °· Do not have sexual intercourse until your health care provider says it is safe to do so. °· You may resume your usual diet. °· Limit strenuous activity. °· Do not take folic acid, prenatal vitamins, or other vitamins that contain folic acid. °· Do not take aspirin, ibuprofen, or naproxen (nonsteroidal anti-inflammatory drugs [NSAIDs]). °· Do not drink alcohol. °SEEK MEDICAL CARE IF:  °· You cannot control your nausea and vomiting. °· You cannot control your diarrhea. °· You have sores in your mouth and want treatment. °· You need pain medicine for your abdominal pain. °· You have a rash. °· You are having a reaction to the medicine. °SEEK IMMEDIATE MEDICAL CARE IF:  °· You have increasing abdominal or pelvic pain. °· You notice increased  bleeding. °· You feel light-headed, or you faint. °· You have shortness of breath. °· Your heart rate increases. °· You have a cough. °· You have chills. °· You have a fever. °Document Released: 01/20/2011 Document Revised: 02/05/2013 Document Reviewed: 11/19/2012 °ExitCare® Patient Information ©2015 [employer]. This information is not intended to replace advice given to you by your health care provider. Make sure you discuss any questions you have with your health care provider. ° °

## 2014-11-03 ENCOUNTER — Telehealth: Payer: Self-pay | Admitting: General Practice

## 2014-11-03 NOTE — Telephone Encounter (Signed)
Per Dr Shawnie Pons, patient needs f/u hcg around 9/21. Called patient, no answer- left message stating we are trying to reach you with results, please call us back at the clinics

## 2014-11-04 NOTE — Telephone Encounter (Signed)
Called patient, no answer- left message stating we are trying to reach you in regards to coming in for an appt, please call us back at the clinics.

## 2014-11-06 ENCOUNTER — Inpatient Hospital Stay (HOSPITAL_COMMUNITY)
Admission: AD | Admit: 2014-11-06 | Discharge: 2014-11-06 | Disposition: A | Discharge status: Home / Self Care | Payer: Self-pay | Source: Ambulatory Visit | Attending: Obstetrics and Gynecology | Admitting: Obstetrics and Gynecology

## 2014-11-06 DIAGNOSIS — O009 Ectopic pregnancy, unspecified: Secondary | ICD-10-CM | POA: Insufficient documentation

## 2014-11-06 DIAGNOSIS — O280 Abnormal hematological finding on antenatal screening of mother: Secondary | ICD-10-CM

## 2014-11-06 DIAGNOSIS — O0281 Inappropriate change in quantitative human chorionic gonadotropin (hCG) in early pregnancy: Secondary | ICD-10-CM

## 2014-11-06 LAB — HCG, QUANTITATIVE, PREGNANCY: HCG, BETA CHAIN, QUANT, S: 58 m[IU]/mL — AB (ref <5)

## 2014-11-06 NOTE — Telephone Encounter (Signed)
Called patient regarding follow up labs needed. Patient answered, stated she had been to MAU this past Sunday (9/18) with "the growth getting larger" and got another dose of methotrexate. Stated she was instructed by the MAU provider to return to MAU for follow up yesterday but she was unable to. Stated she intended to come into MAU today for necessary follow up labs.

## 2014-11-06 NOTE — MAU Provider Note (Signed)
[name] is a 39 y.o. [id number] who presents to the MAU for follow up Bhcg s/p day 4 after second dose of MTX. She reports minimal pain.   Results for orders placed or performed during the hospital encounter of 11/06/14 (from the past 24 hour(s))  hCG, quantitative, pregnancy     Status: Abnormal   Collection Time: 11/06/14  7:18 PM  Result Value Ref Range   hCG, Beta Chain, Quant, S 58 (H) <5 mIU/mL    I discussed with the patient need for follow up day 7 for Bhcg. She will continue strict ectopic precautions. She will return immediately for increased pain or bleeding.

## 2014-11-06 NOTE — Discharge Instructions (Signed)
Your hormone level has dropped today to 58 Results for orders placed or performed during the hospital encounter of 11/06/14 (from the past 24 hour(s))  hCG, quantitative, pregnancy     Status: Abnormal   Collection Time: 11/06/14  7:18 PM  Result Value Ref Range   hCG, Beta Chain, Quant, S 58 (H) <5 mIU/mL    Return on Sept. 25th to have it repeated or sooner for any problems. No sex, no tampons, nothing in the vagina until follow up.

## 2016-02-15 DIAGNOSIS — O009 Unspecified ectopic pregnancy without intrauterine pregnancy: Secondary | ICD-10-CM

## 2016-02-15 HISTORY — DX: Unspecified ectopic pregnancy without intrauterine pregnancy: O00.90

## 2016-06-15 IMAGING — US US OB COMP LESS 14 WK
1 series · 13 of 28 positions shown · non-contrast
Comparison: None.

CLINICAL DATA: Vaginal bleeding

EXAM:
OBSTETRIC <14 WK US AND TRANSVAGINAL OB US
TECHNIQUE: Both transabdominal and transvaginal ultrasound examinations were
performed for complete evaluation of the gestation as well as the
maternal uterus, adnexal regions, and pelvic cul-de-sac.
Transvaginal technique was performed to assess early pregnancy.

[Series 1: us ob comp less 14 wk · 0.17mm/px · 13 of 58 slices shown]
[im 3/58]
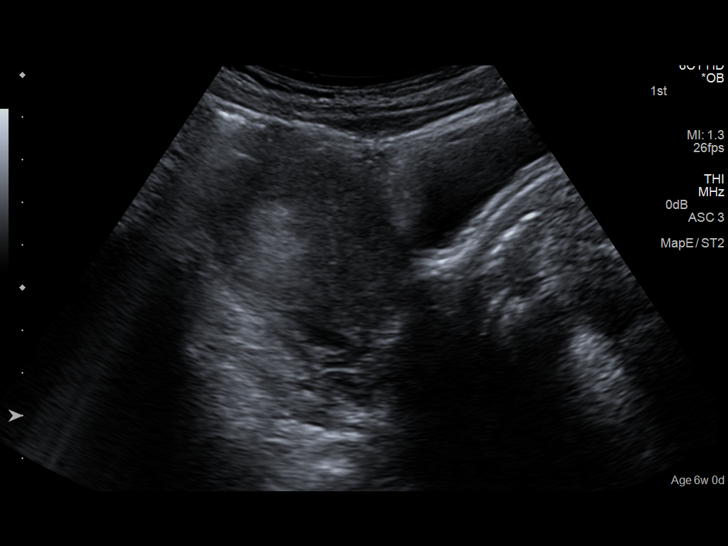
[im 7/58]
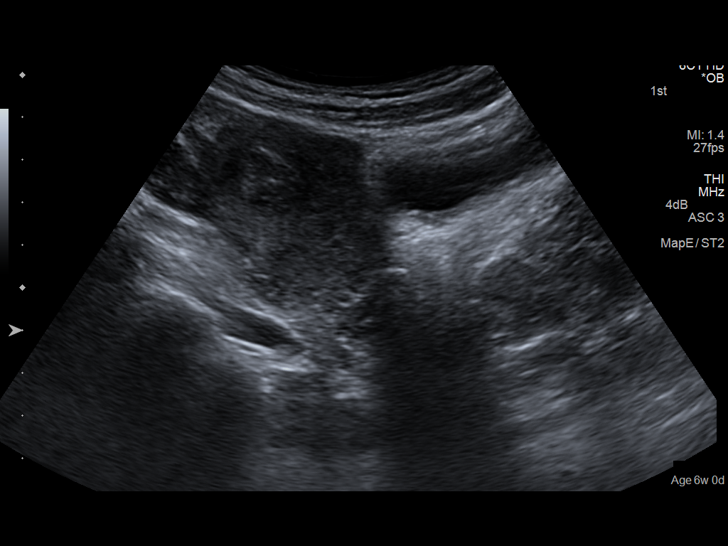
[im 11/58]
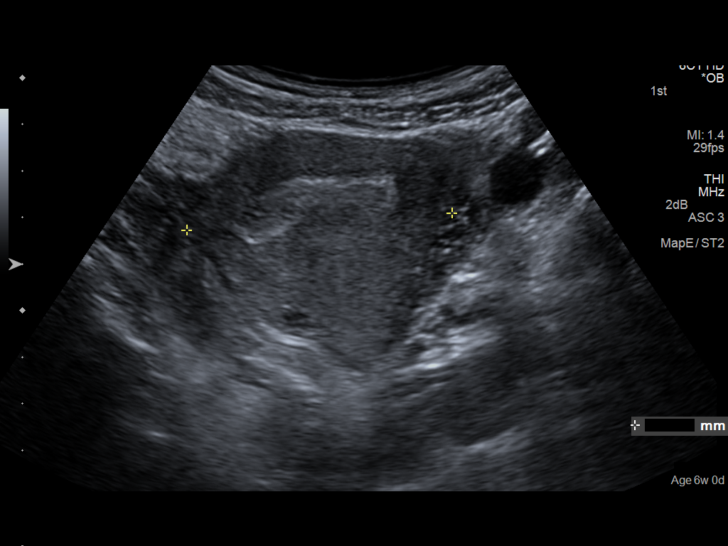
[im 15/58]
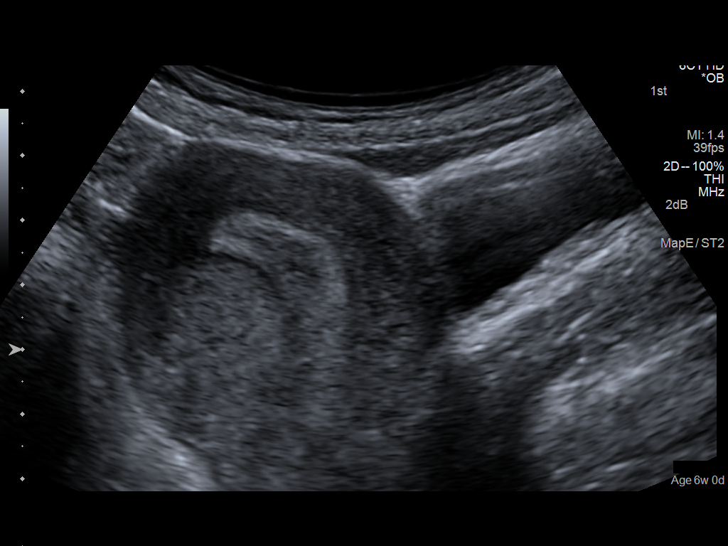
[im 20/58]
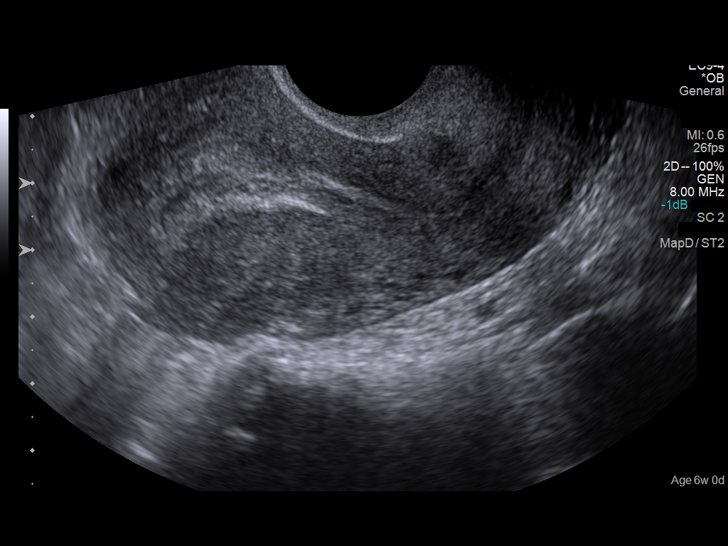
[im 24/58]
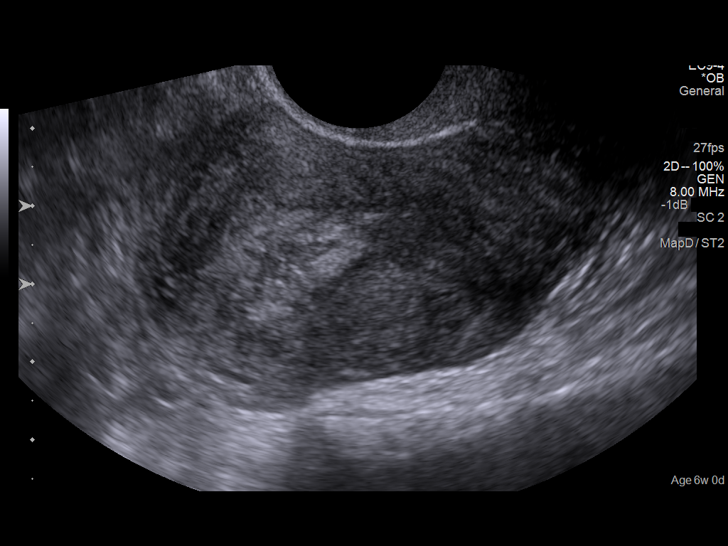
[im 30/58]
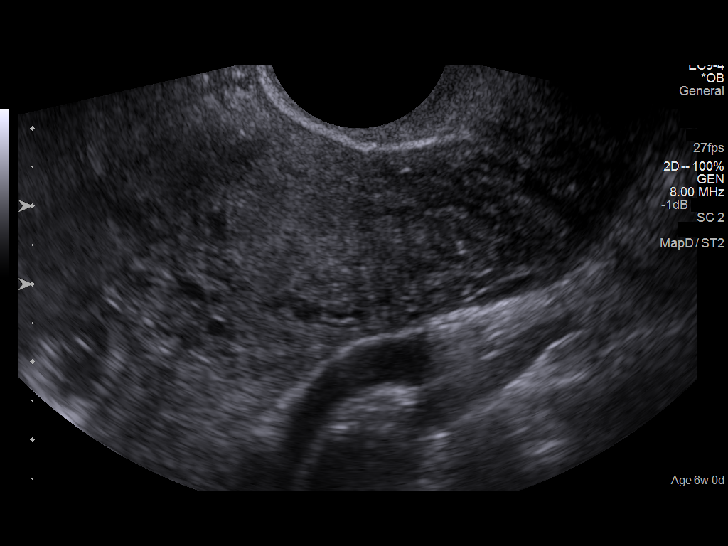
[im 34/58]
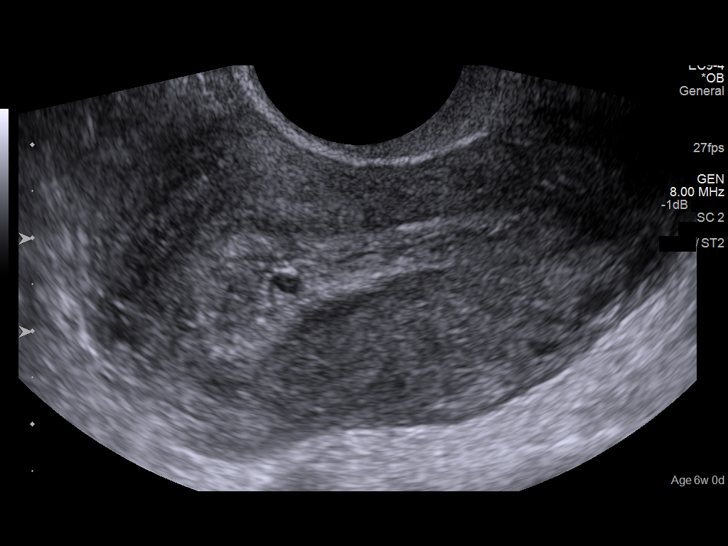
[im 39/58]
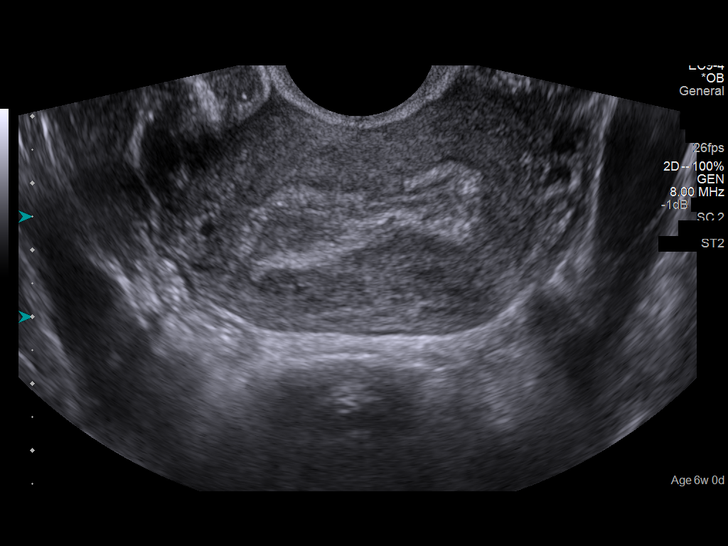
[im 43/58]
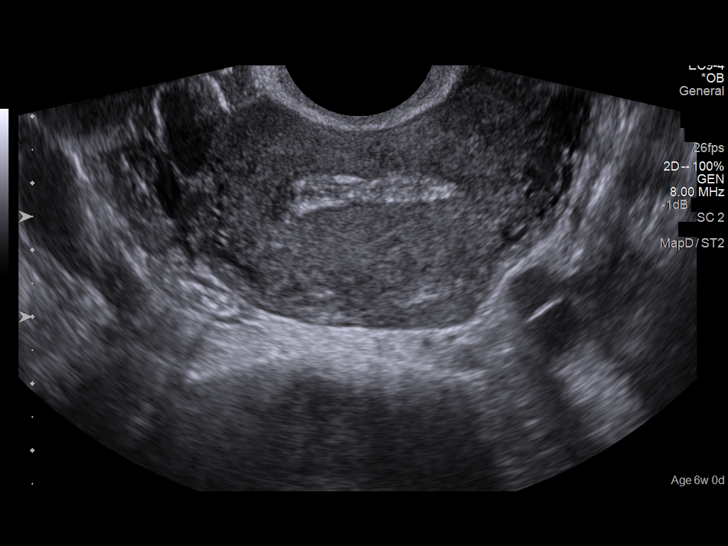
[im 47/58]
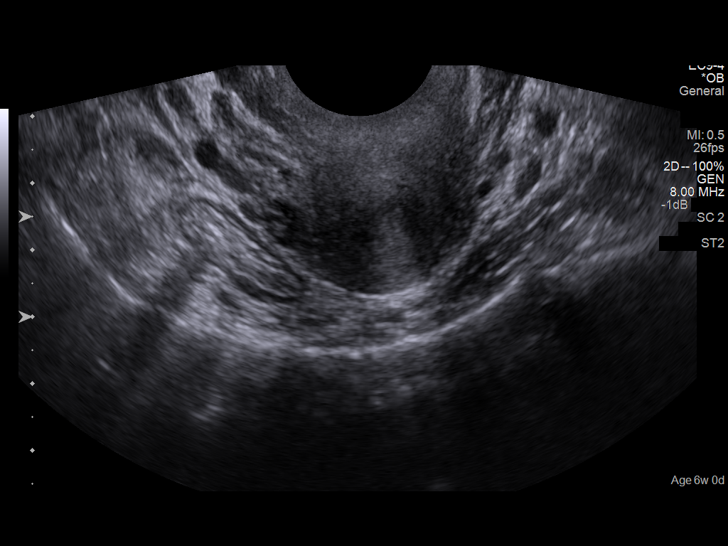
[im 51/58]
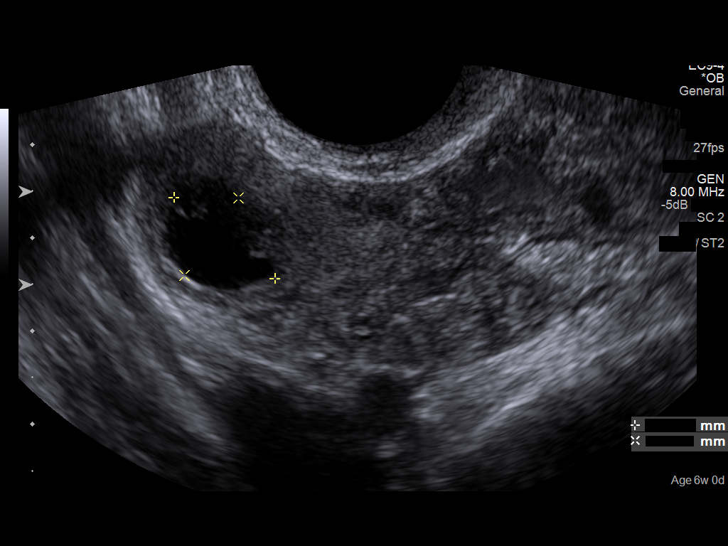
[im 55/58]
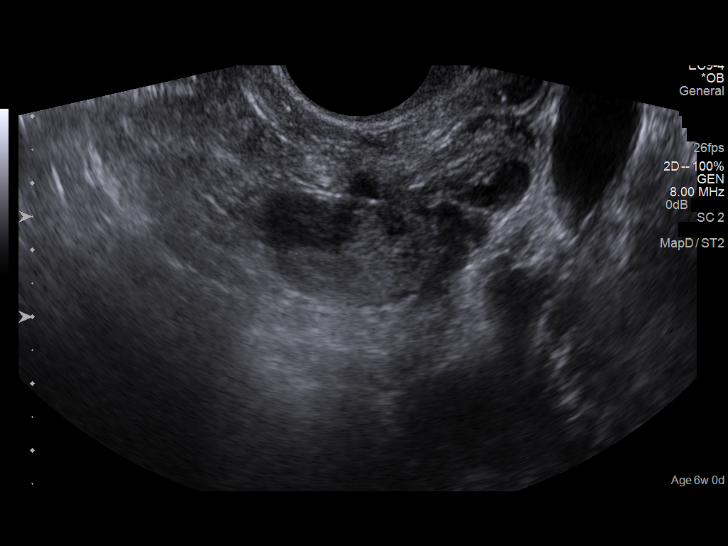

[13 of 28 positions shown; findings below may reference images not displayed]

FINDINGS: Intrauterine gestational sac: Not visualized

Yolk sac:  Not visualized

Embryo:  Not visualized

Cardiac Activity: Not visualized

Maternal uterus/adnexae: Uterus measures 8.4 x 4.8 x 6.0 cm size.
Endometrium measures 12 mm in thickness with a smooth contour. No
intrauterine mass. Right ovary measures 3.3 x 2.7 x 1.9 cm. There is
a probable hemorrhagic corpus luteum in the right ovary measuring
1.4 x 1.0 cm. The left ovary measures 2.8 x 1.6 x 2.2 cm. No free
pelvic fluid.
IMPRESSION: No intrauterine mass. No intrauterine gestation. Probable
hemorrhagic corpus luteum. Beta HCG measurement reportedly in
progress. If pregnancy test is found to be positive, differential
considerations for the findings above would include intrauterine
gestation too early to be seen by either transabdominal or
transvaginal technique; recent spontaneous abortion ; possible
ectopic gestation. Timing of repeat ultrasound will in part depend
on clinical findings and serial beta HCG evaluations.

## 2016-12-21 IMAGING — US US OB TRANSVAGINAL
1 series · 14 of 28 positions shown · non-contrast
Comparison: 09/19/2014.

CLINICAL DATA: Bleeding.  Positive serum beta HCG.

EXAM:
OBSTETRIC <14 WK US AND TRANSVAGINAL OB US
TECHNIQUE: Both transabdominal and transvaginal ultrasound examinations were
performed for complete evaluation of the gestation as well as the
maternal uterus, adnexal regions, and pelvic cul-de-sac.
Transvaginal technique was performed to assess early pregnancy.

[Series 1: us ob transvaginal · 14 of 34 slices shown]
[im 2/34]
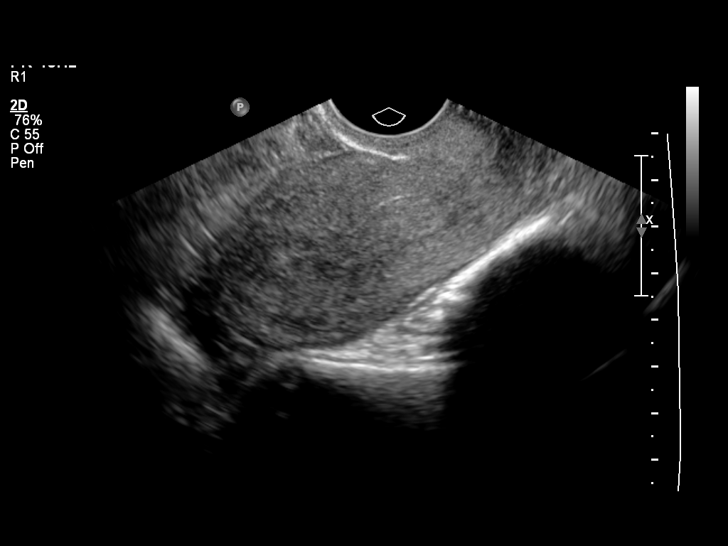
[im 4/34]
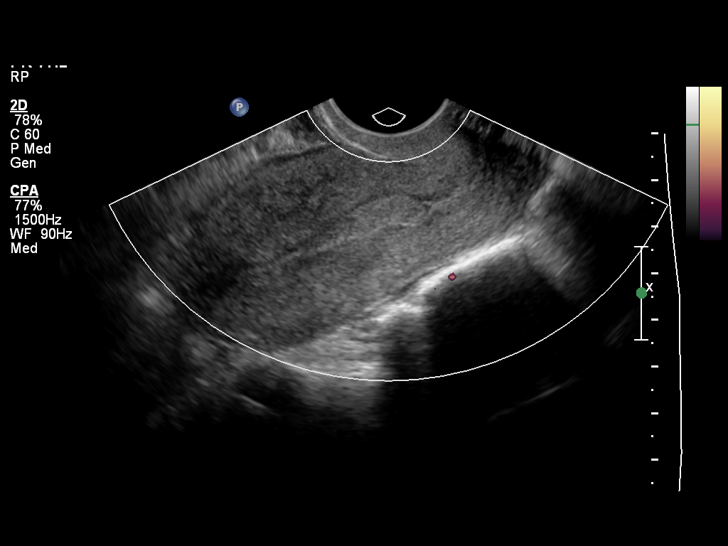
[im 7/34]
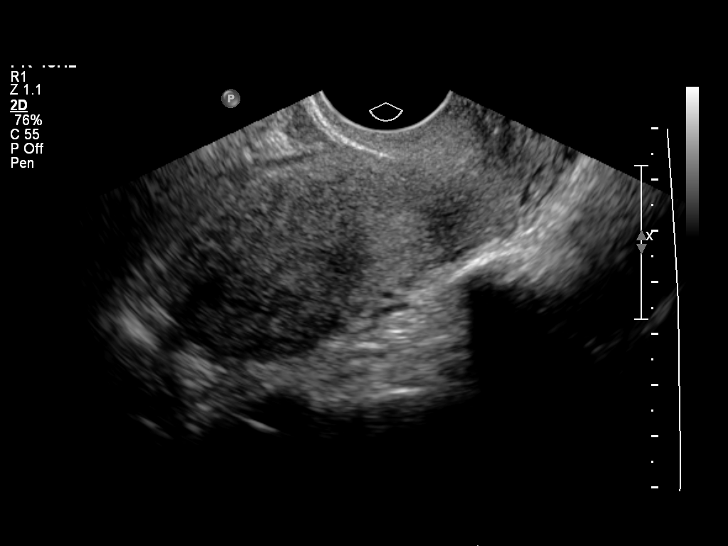
[im 9/34]
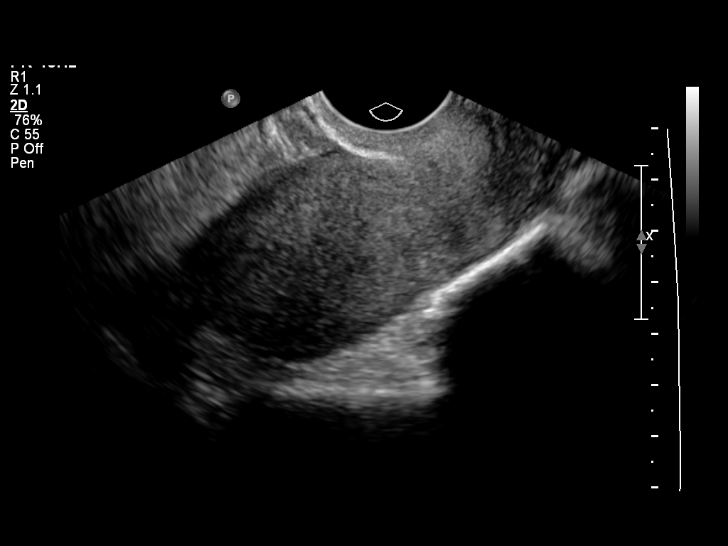
[im 12/34]
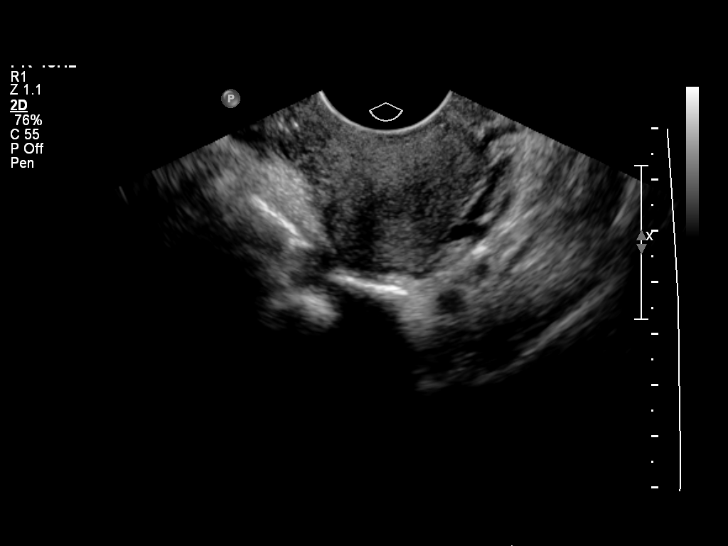
[im 14/34]
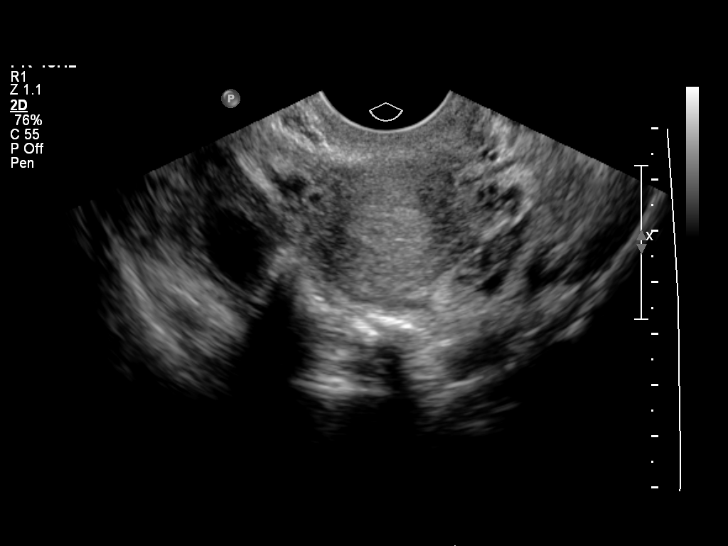
[im 16/34]
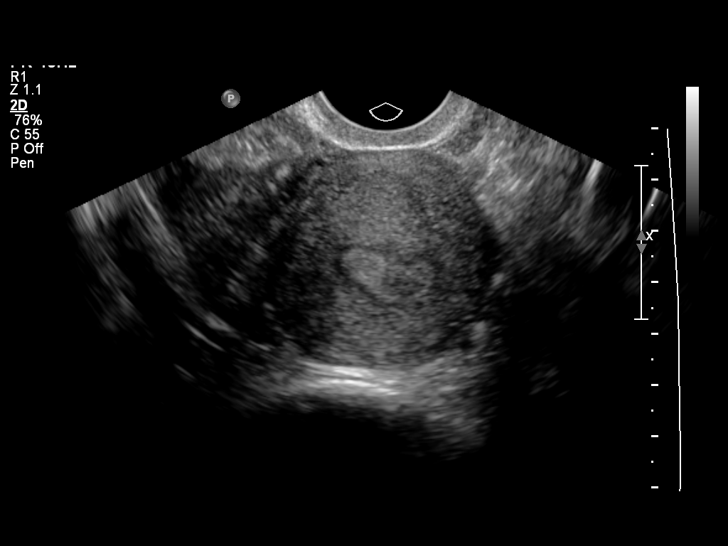
[im 19/34]
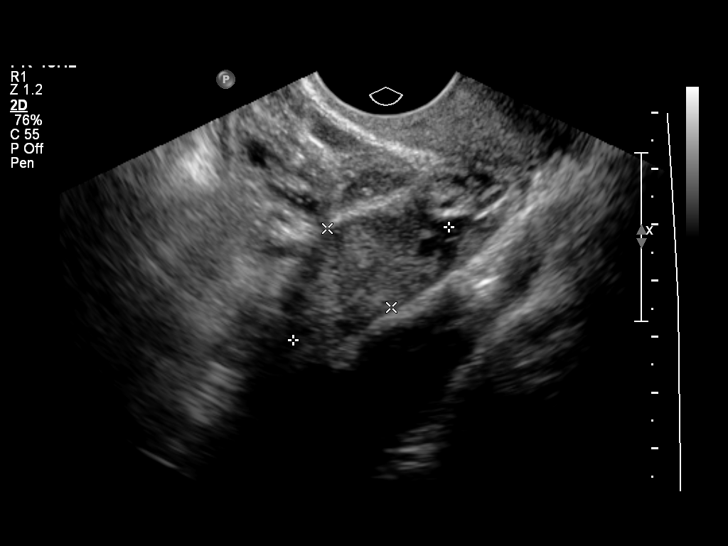
[im 21/34]
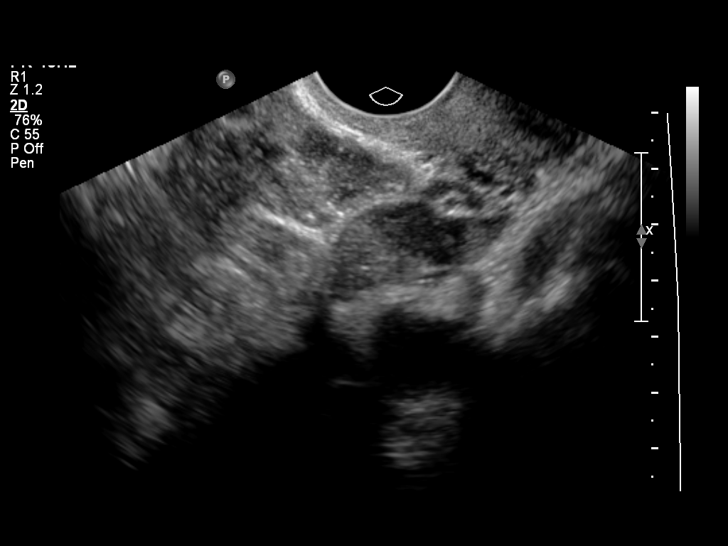
[im 24/34]
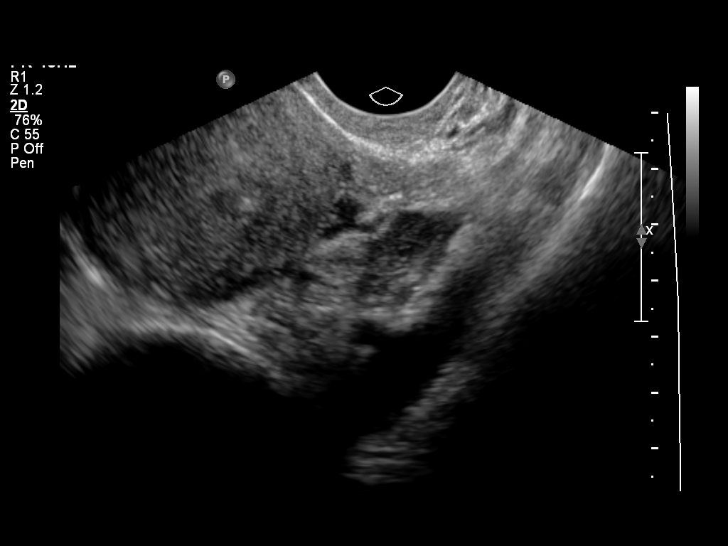
[im 26/34]
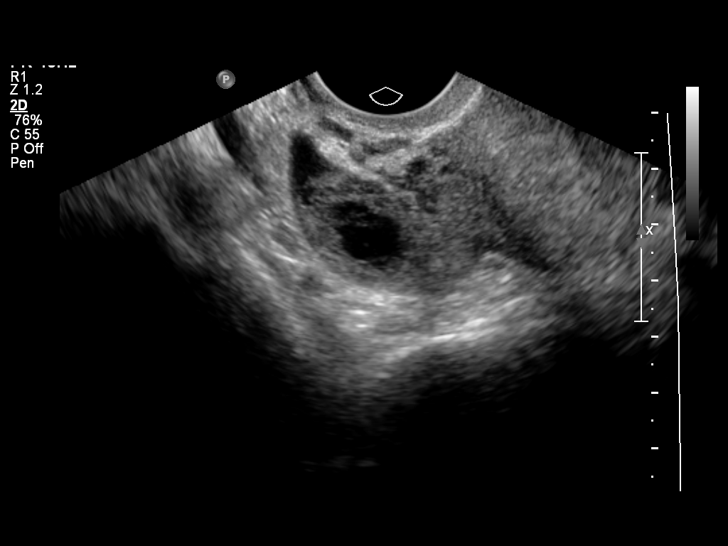
[im 29/34]
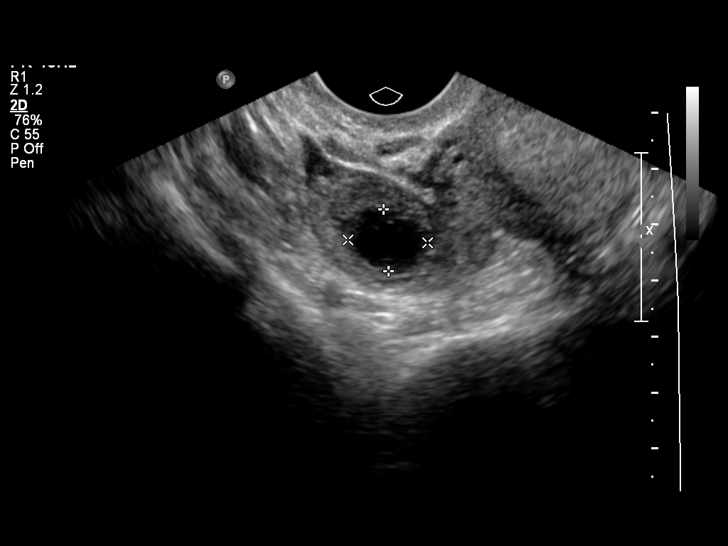
[im 31/34]
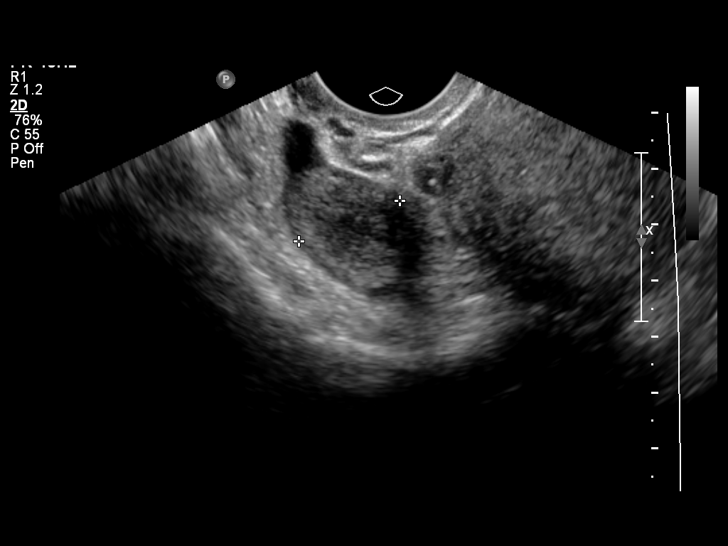
[im 34/34]
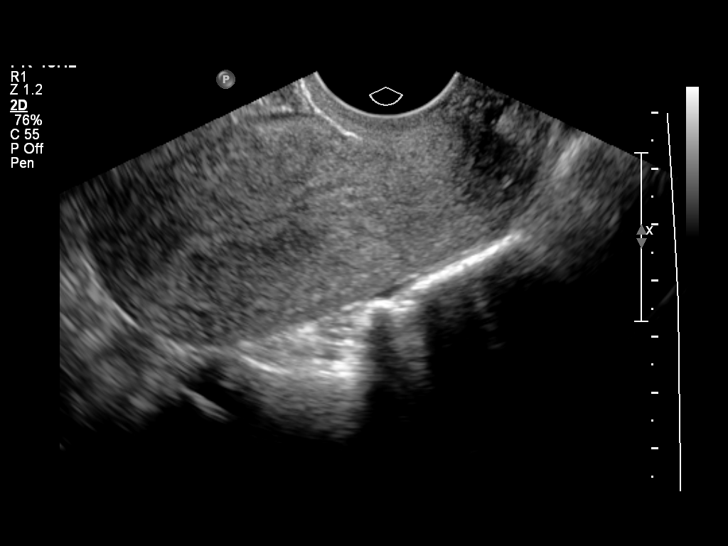

[14 of 28 positions shown; findings below may reference images not displayed]

FINDINGS: Intrauterine gestational sac: None

Yolk sac:  No

Embryo:  No

Cardiac Activity: No

Heart Rate: Not applicable  bpm

Maternal uterus/adnexae:

Subchorionic hemorrhage: Not applicable

Right ovary: Right ovary corpus luteal cyst measures 1.4 x 1.1 x
cm

Left ovary: Appears normal

Other :None

Free fluid:  None
IMPRESSION: 1. No intrauterine gestational sac, yolk sac, or fetal pole
identified. Differential considerations include intrauterine
pregnancy too early to be sonographically visualized, missed
abortion, or ectopic pregnancy. Followup ultrasound is recommended
in 10-14 days for further evaluation.

## 2017-01-01 IMAGING — US US OB TRANSVAGINAL
1 series · 14 of 28 positions shown · non-contrast
Comparison: Ultrasound 09/22/2014

CLINICAL DATA: Patient with inconclusive fetal viability. Follow-up
evaluation.

EXAM:
TRANSVAGINAL OB ULTRASOUND
TECHNIQUE: Transvaginal ultrasound was performed for complete evaluation of the
gestation as well as the maternal uterus, adnexal regions, and
pelvic cul-de-sac.

[Series 1: us ob follow up · 65 acquisitions, 14 frames shown]
[im 3/65]
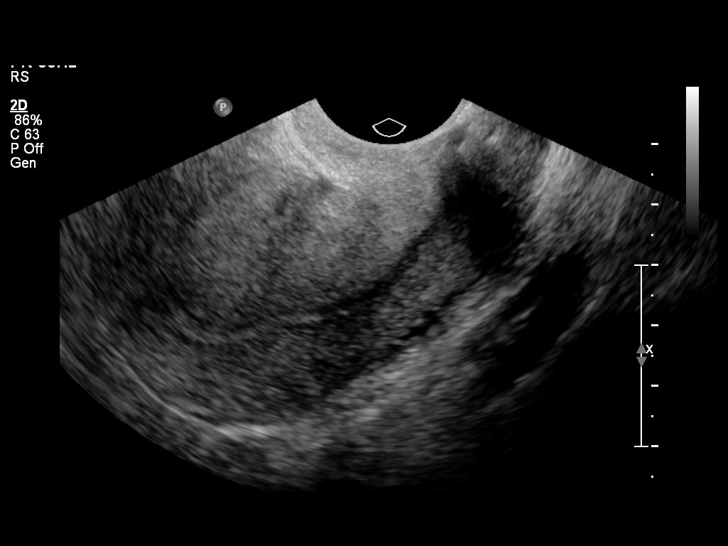
[im 8/65]
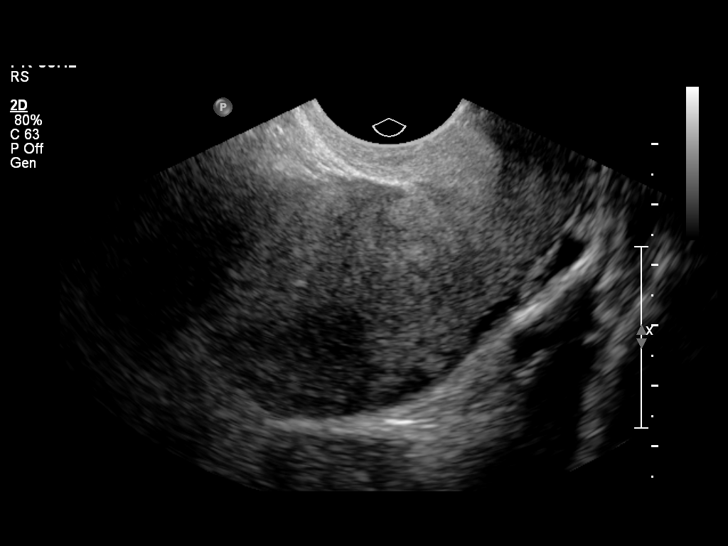
[im 12/65]
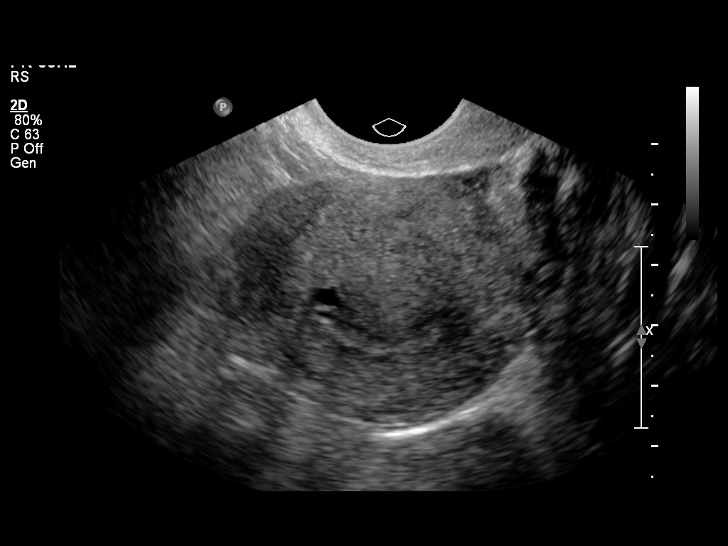
[im 17/65]
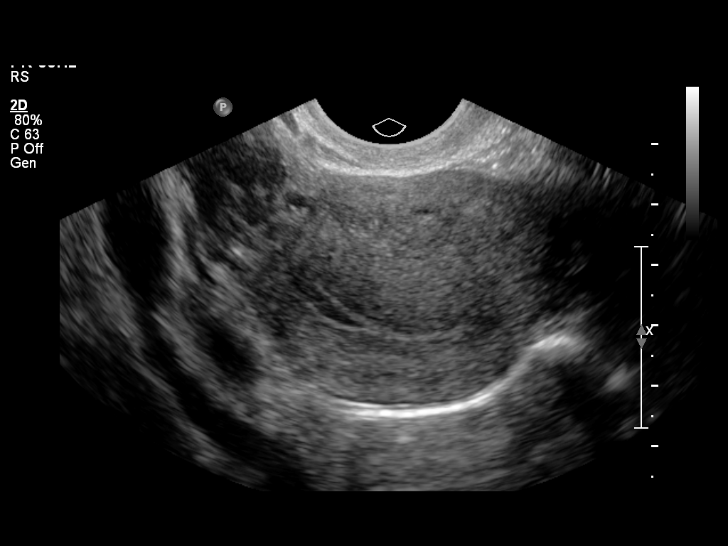
[im 22/65]
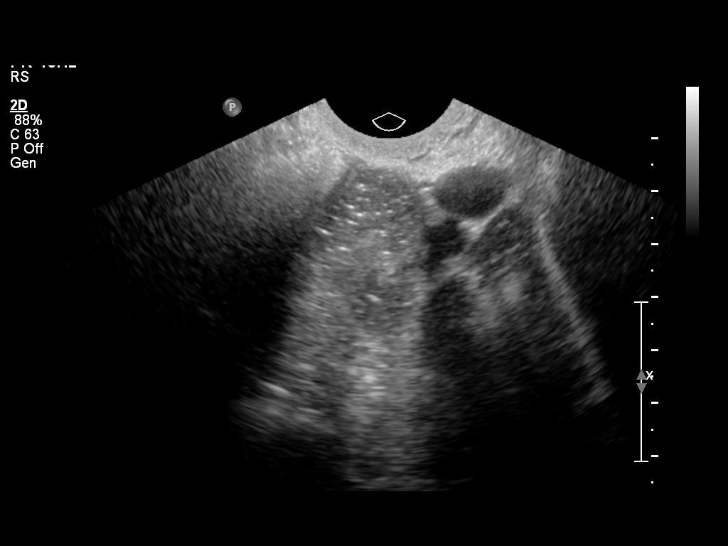
[im 27/65]
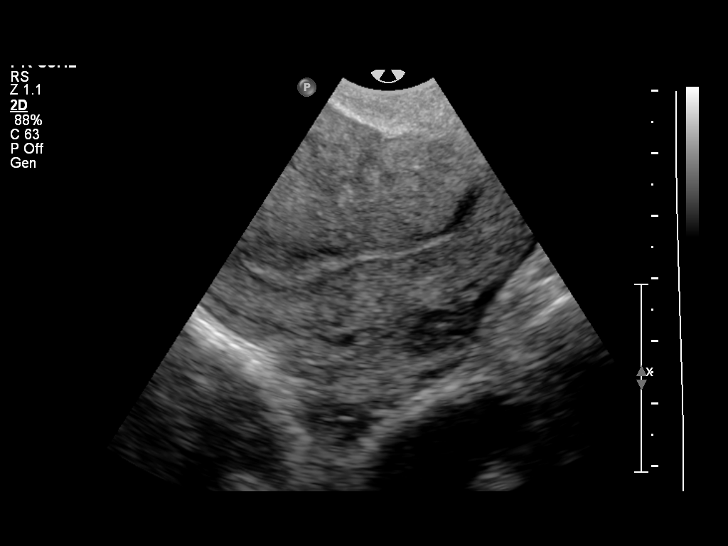
[im 31/65]
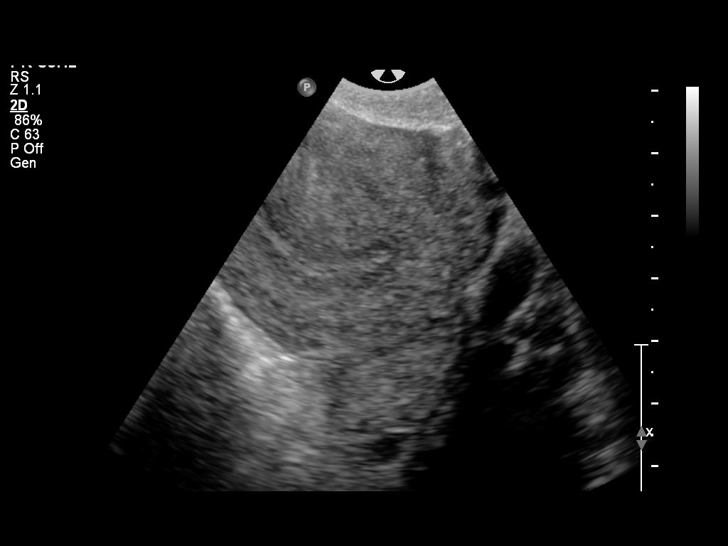
[im 36/65]
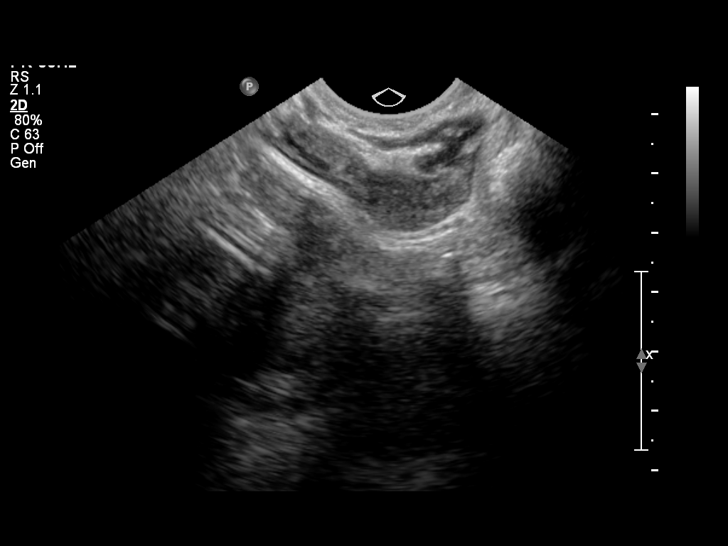
[im 41/65]
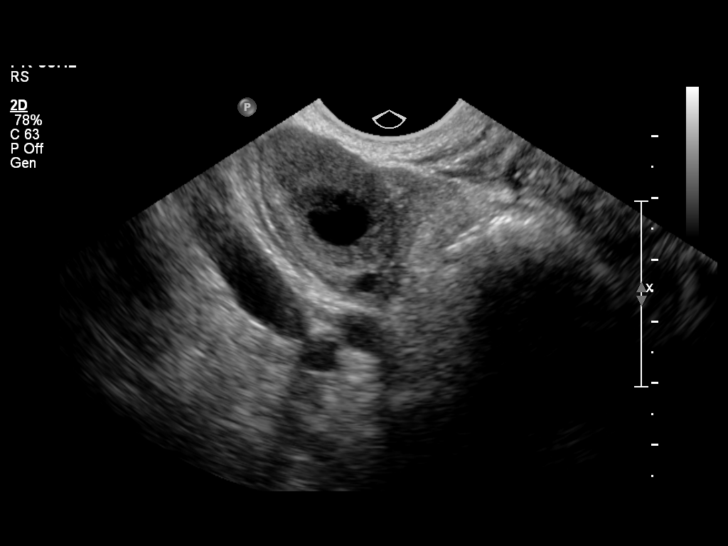
[im 46/65]
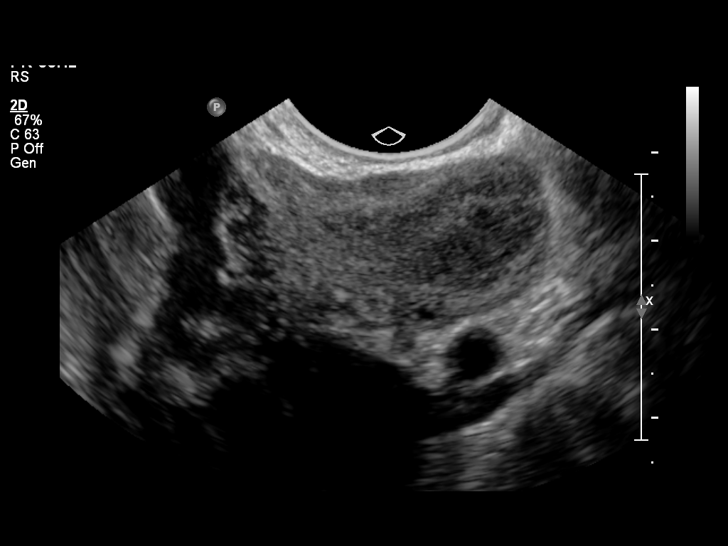
[im 50/65]
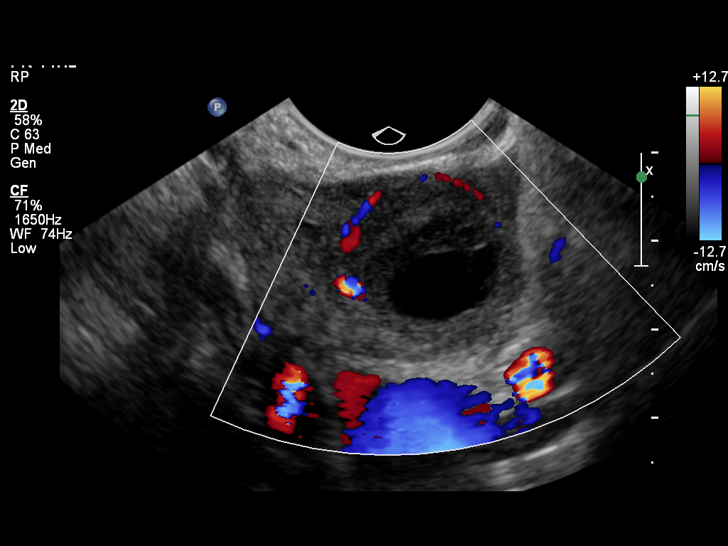
[im 55/65]
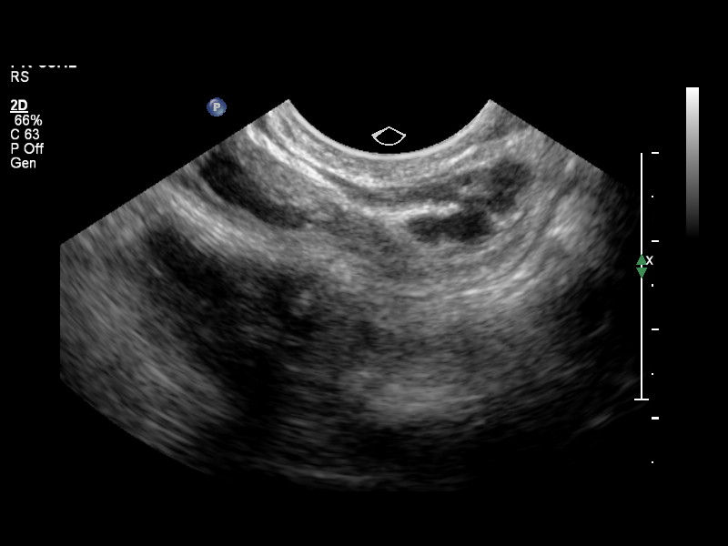
[im 60/65]
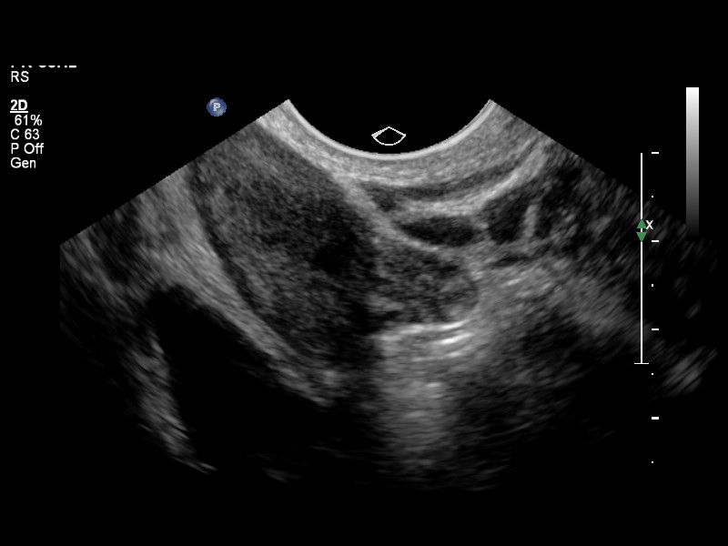
[im 65/65]
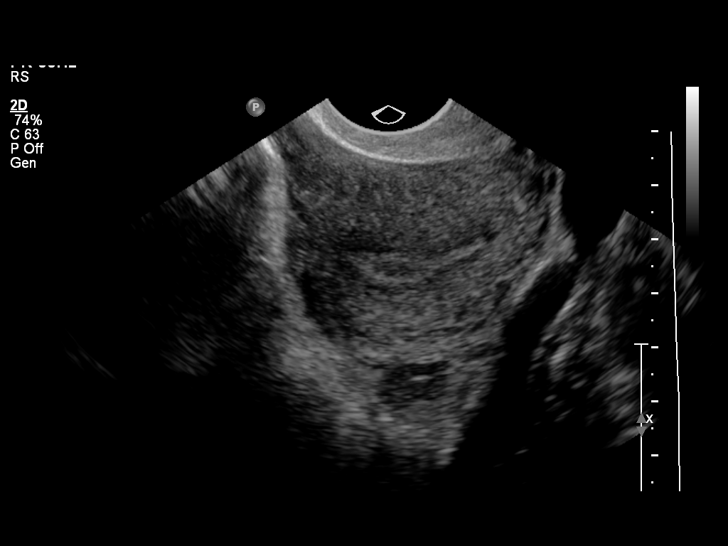

[14 of 28 positions shown; findings below may reference images not displayed]

FINDINGS: Intrauterine gestational sac: Not present

Yolk sac:  Not present

Embryo:  Not present

Cardiac Activity: Not present

Maternal uterus/adnexae: Small amount of fluid within the
endometrium. There is a right adnexal mass measuring 1.4 x 1.0 x
cm adjacent to the right ovary. This has internal color flow. Right
ovarian corpus luteum. Left ovary is normal. No significant free
fluid in the pelvis.
IMPRESSION: There is a 1.4 cm vascular right adnexal mass adjacent to the right
ovary suspicious for ectopic pregnancy.

No definite IUP identified.

Critical Value/emergent results were called by telephone at the time
of interpretation on 10/03/2014 at [DATE] to Dr. LEPESTEN BARTHELUS , who
verbally acknowledged these results.

## 2018-05-09 ENCOUNTER — Encounter (HOSPITAL_COMMUNITY): Payer: Self-pay | Admitting: *Deleted

## 2018-05-09 ENCOUNTER — Inpatient Hospital Stay (HOSPITAL_COMMUNITY): Payer: BLUE CROSS/BLUE SHIELD

## 2018-05-09 ENCOUNTER — Other Ambulatory Visit: Payer: Self-pay

## 2018-05-09 ENCOUNTER — Inpatient Hospital Stay (HOSPITAL_COMMUNITY)
Admission: AD | Admit: 2018-05-09 | Discharge: 2018-05-09 | Disposition: A | Discharge status: Home / Self Care | Payer: BLUE CROSS/BLUE SHIELD | Attending: Obstetrics and Gynecology | Admitting: Obstetrics and Gynecology

## 2018-05-09 DIAGNOSIS — Z6790 Unspecified blood type, Rh positive: Secondary | ICD-10-CM

## 2018-05-09 DIAGNOSIS — F419 Anxiety disorder, unspecified: Secondary | ICD-10-CM | POA: Insufficient documentation

## 2018-05-09 DIAGNOSIS — O99341 Other mental disorders complicating pregnancy, first trimester: Secondary | ICD-10-CM | POA: Insufficient documentation

## 2018-05-09 DIAGNOSIS — Z3A01 Less than 8 weeks gestation of pregnancy: Secondary | ICD-10-CM | POA: Diagnosis not present

## 2018-05-09 DIAGNOSIS — O4691 Antepartum hemorrhage, unspecified, first trimester: Secondary | ICD-10-CM

## 2018-05-09 DIAGNOSIS — O161 Unspecified maternal hypertension, first trimester: Secondary | ICD-10-CM | POA: Diagnosis not present

## 2018-05-09 DIAGNOSIS — O360110 Maternal care for anti-D [Rh] antibodies, first trimester, not applicable or unspecified: Secondary | ICD-10-CM

## 2018-05-09 DIAGNOSIS — O4690 Antepartum hemorrhage, unspecified, unspecified trimester: Secondary | ICD-10-CM

## 2018-05-09 DIAGNOSIS — Z8249 Family history of ischemic heart disease and other diseases of the circulatory system: Secondary | ICD-10-CM | POA: Insufficient documentation

## 2018-05-09 HISTORY — DX: Unspecified ectopic pregnancy without intrauterine pregnancy: O00.90

## 2018-05-09 HISTORY — DX: Anxiety disorder, unspecified: F41.9

## 2018-05-09 HISTORY — DX: Encounter for other specified aftercare: Z51.89

## 2018-05-09 LAB — CBC
HCT: 34 % — ABNORMAL LOW (ref 36.0–46.0)
Hemoglobin: 11.1 g/dL — ABNORMAL LOW (ref 12.0–15.0)
MCH: 30.6 pg (ref 26.0–34.0)
MCHC: 32.6 g/dL (ref 30.0–36.0)
MCV: 93.7 fL (ref 80.0–100.0)
NRBC: 0 % (ref 0.0–0.2)
Platelets: 232 10*3/uL (ref 150–400)
RBC: 3.63 MIL/uL — AB (ref 3.87–5.11)
RDW: 13.6 % (ref 11.5–15.5)
WBC: 7.9 10*3/uL (ref 4.0–10.5)

## 2018-05-09 LAB — WET PREP, GENITAL
Sperm: NONE SEEN
Trich, Wet Prep: NONE SEEN
Yeast Wet Prep HPF POC: NONE SEEN

## 2018-05-09 LAB — URINALYSIS, ROUTINE W REFLEX MICROSCOPIC
Bilirubin Urine: NEGATIVE
GLUCOSE, UA: NEGATIVE mg/dL
Hgb urine dipstick: NEGATIVE
KETONES UR: NEGATIVE mg/dL
Nitrite: NEGATIVE
PH: 5 (ref 5.0–8.0)
Protein, ur: NEGATIVE mg/dL
SPECIFIC GRAVITY, URINE: 1.026 (ref 1.005–1.030)

## 2018-05-09 LAB — HCG, QUANTITATIVE, PREGNANCY: hCG, Beta Chain, Quant, S: 16555 m[IU]/mL — ABNORMAL HIGH (ref <5)

## 2018-05-09 LAB — POCT PREGNANCY, URINE: Preg Test, Ur: POSITIVE — AB

## 2018-05-09 NOTE — MAU Note (Signed)
+  HPT last wkend.  4 live births, hx of ectopic and a miscarriage last year. The symptoms she had had are weakening. Started bleeding this morning. Denies cramping.

## 2018-05-09 NOTE — Discharge Instructions (Signed)
Vaginal Bleeding During Pregnancy, First Trimester    A small amount of bleeding (spotting) from the vagina is common during early pregnancy. Sometimes the bleeding is normal and does not cause problems. At other times, though, bleeding may be a sign of something serious. Tell your doctor about any bleeding from your vagina right away.  Follow these instructions at home:  Activity  · Follow your doctor's instructions about how active you can be.  · If needed, make plans for someone to help with your normal activities.  · Do not have sex or orgasms until your doctor says that this is safe.  General instructions  · Take over-the-counter and prescription medicines only as told by your doctor.  · Watch your condition for any changes.  · Write down:  ? The number of pads you use each day.  ? How often you change pads.  ? How soaked (saturated) your pads are.  · Do not use tampons.  · Do not douche.  · If you pass any tissue from your vagina, save it to show to your doctor.  · Keep all follow-up visits as told by your doctor. This is important.  Contact a doctor if:  · You have vaginal bleeding at any time while you are pregnant.  · You have cramps.  · You have a fever.  Get help right away if:  · You have very bad cramps in your back or belly (abdomen).  · You pass large clots or a lot of tissue from your vagina.  · Your bleeding gets worse.  · You feel light-headed.  · You feel weak.  · You pass out (faint).  · You have chills.  · You are leaking fluid from your vagina.  · You have a gush of fluid from your vagina.  Summary  · Sometimes vaginal bleeding during pregnancy is normal and does not cause problems. At other times, bleeding may be a sign of something serious.  · Tell your doctor about any bleeding from your vagina right away.  · Follow your doctor's instructions about how active you can be. You may need someone to help you with your normal activities.  This information is not intended to replace advice given to  you by your health care provider. Make sure you discuss any questions you have with your health care provider.  Document Released: 06/17/2013 Document Revised: 05/04/2016 Document Reviewed: 05/04/2016  Elsevier Interactive Patient Education © 2019 [employer] Inc[employer]

## 2018-05-09 NOTE — MAU Provider Note (Signed)
History     CSN: [id number]  Arrival date and time: 05/09/18 1455   First Provider Initiated Contact with Patient 05/09/18 1601      Chief Complaint  Patient presents with  . Vaginal Bleeding  . Possible Pregnancy   43 y.o. [id number]  by LMP presenting with VB. Sx started this am. Bleeding enough for a liner. Denies pain. She is concerned that she may have another "chemical pregnancy" because her pregnancy sx are lessening. This pregnancy is unplanned.   OB History    Gravida  7   Para  4   Term  4   Preterm      AB  2   Living  4     SAB  1   TAB      Ectopic  1   Multiple  0   Live Births  4        Obstetric Comments  3rd preg required a blood transfusion after delivery, then had "too much fluid" and had to go to ICU        Past Medical History:  Diagnosis Date  . Anxiety    social  . Blood transfusion without reported diagnosis    after 3rd delivery  . Ectopic pregnancy 2018   received Methotrexate x2  . Hypertension     Past Surgical History:  Procedure Laterality Date  . NO PAST SURGERIES      Family History  Problem Relation Age of Onset  . Hypertension Mother   . Hypertension Father   . Hearing loss Neg Hx     Social History   Tobacco Use  . Smoking status: Never Smoker  . Smokeless tobacco: Never Used  Substance Use Topics  . Alcohol use: Yes    Comment: twice a wk  . Drug use: No    Allergies: No Known Allergies  No medications prior to admission.    Review of Systems  Gastrointestinal: Negative for abdominal pain.  Genitourinary: Positive for vaginal bleeding.   Physical Exam   Blood pressure (!) 147/81, pulse 86, temperature 98.8 F (37.1 C), temperature source Oral, resp. rate 18, height  (1.6 m), weight 68.9 kg, last menstrual period 03/25/2018, SpO2 100 %, unknown if currently breastfeeding.  Physical Exam  Constitutional: She is oriented to person, place, and time. She appears well-developed and  well-nourished. No distress.  HENT:  Head: Normocephalic and atraumatic.  Neck: Normal range of motion.  Cardiovascular: Normal rate.  Respiratory: Effort normal. No respiratory distress.  Genitourinary:    Genitourinary Comments: External: no lesions or erythema Vagina: rugated, pink, moist, small yellow discharge, no blood Uterus: non enlarged, anteverted, non tender, no CMT Adnexae: no masses, no tenderness left, no tenderness right Cervix closed    Neurological: She is alert and oriented to person, place, and time.  Skin: Skin is warm and dry.  Psychiatric: She has a normal mood and affect.   Results for orders placed or performed during the hospital encounter of 05/09/18 (from the past 24 hour(s))  Pregnancy, urine POC     Status: Abnormal   Collection Time: 05/09/18  3:40 PM  Result Value Ref Range   Preg Test, Ur POSITIVE (A) NEGATIVE  Urinalysis, Routine w reflex microscopic     Status: Abnormal   Collection Time: 05/09/18  3:43 PM  Result Value Ref Range   Color, Urine YELLOW YELLOW   APPearance CLEAR CLEAR   Specific Gravity, Urine 1.026 1.005 - 1.030   pH 5.0  5.0 - 8.0   Glucose, UA NEGATIVE NEGATIVE mg/dL   Hgb urine dipstick NEGATIVE NEGATIVE   Bilirubin Urine NEGATIVE NEGATIVE   Ketones, ur NEGATIVE NEGATIVE mg/dL   Protein, ur NEGATIVE NEGATIVE mg/dL   Nitrite NEGATIVE NEGATIVE   Leukocytes,Ua MODERATE (A) NEGATIVE   RBC / HPF 0-5 0 - 5 RBC/hpf   WBC, UA 0-5 0 - 5 WBC/hpf   Bacteria, UA RARE (A) NONE SEEN   Squamous Epithelial / LPF 0-5 0 - 5   Mucus PRESENT   CBC     Status: Abnormal   Collection Time: 05/09/18  4:10 PM  Result Value Ref Range   WBC 7.9 4.0 - 10.5 K/uL   RBC 3.63 (L) 3.87 - 5.11 MIL/uL   Hemoglobin 11.1 (L) 12.0 - 15.0 g/dL   HCT 79.9 (L) 87.2 - 15.8 %   MCV 93.7 80.0 - 100.0 fL   MCH 30.6 26.0 - 34.0 pg   MCHC 32.6 30.0 - 36.0 g/dL   RDW 72.7 61.8 - 48.5 %   Platelets 232 150 - 400 K/uL   nRBC 0.0 0.0 - 0.2 %  hCG, quantitative,  pregnancy     Status: Abnormal   Collection Time: 05/09/18  4:10 PM  Result Value Ref Range   hCG, Beta Chain, Quant, S 16,555 (H) <5 mIU/mL  Wet prep, genital     Status: Abnormal   Collection Time: 05/09/18  4:18 PM  Result Value Ref Range   Yeast Wet Prep HPF POC NONE SEEN NONE SEEN   Trich, Wet Prep NONE SEEN NONE SEEN   Clue Cells Wet Prep HPF POC PRESENT (A) NONE SEEN   WBC, Wet Prep HPF POC MANY (A) NONE SEEN   Sperm NONE SEEN    US Ob Less Than 14 Weeks With Ob Transvaginal  Result Date: 05/09/2018 CLINICAL DATA:  Initial evaluation for acute vaginal bleeding, early pregnancy. EXAM: OBSTETRIC <14 WK [place] AND TRANSVAGINAL OB US TECHNIQUE: Both transabdominal and transvaginal ultrasound examinations were performed for complete evaluation of the gestation as well as the maternal uterus, adnexal regions, and pelvic cul-de-sac. Transvaginal technique was performed to assess early pregnancy. COMPARISON:  None. FINDINGS: Intrauterine gestational sac: Single Yolk sac:  Present Embryo:  Present Cardiac Activity: Possible faint cardiac activity versus maternal pulse. Heart Rate: Not well assessed due to faint signal. CRL: 2.14 mm   5 w   5 d                  [place] EDC: 01/04/2019 Subchorionic hemorrhage:  None visualized. Maternal uterus/adnexae: Ovaries are normal in appearance bilaterally. Trace free fluid noted within the posterior pelvic cul-de-sac. IMPRESSION: 1. Single intrauterine pregnancy with internal yolk sac and embryo with possible faint cardiac activity. Estimated gestational age [redacted] weeks and 5 days by crown-rump length, with ultrasound EDC of 01/04/2019. Close clinical monitoring with serial beta hCGs and close interval follow-up ultrasound to assess viability suggested. 2. No other acute maternal uterine or adnexal abnormality. Electronically Signed   By: Rise Mu M.D.   On: 05/09/2018 17:19    MAU Course  Procedures Orders Placed This Encounter  Procedures  . Wet prep,  genital    Standing Status:   Standing    Number of Occurrences:   1    Order Specific Question:   Patient immune status    Answer:   Normal  . US OB LESS THAN 14 WEEKS WITH OB TRANSVAGINAL    Standing Status:   Standing  Number of Occurrences:   1    Order Specific Question:   Symptom/Reason for Exam    Answer:   Vaginal bleeding in pregnancy [705036]  . Urinalysis, Routine w reflex microscopic    Standing Status:   Standing    Number of Occurrences:   1  . CBC    Standing Status:   Standing    Number of Occurrences:   1  . hCG, quantitative, pregnancy    Standing Status:   Standing    Number of Occurrences:   1  . Pregnancy, urine POC    Standing Status:   Standing    Number of Occurrences:   1  . Discharge patient    Order Specific Question:   Discharge disposition    Answer:   01-Home or Self Care [1]    Order Specific Question:   Discharge patient date    Answer:   05/09/2018   US Ob Less Than 14 Weeks With Ob Transvaginal  Result Date: 05/09/2018 CLINICAL DATA:  Initial evaluation for acute vaginal bleeding, early pregnancy. EXAM: OBSTETRIC <14 WK [place] AND TRANSVAGINAL OB US TECHNIQUE: Both transabdominal and transvaginal ultrasound examinations were performed for complete evaluation of the gestation as well as the maternal uterus, adnexal regions, and pelvic cul-de-sac. Transvaginal technique was performed to assess early pregnancy. COMPARISON:  None. FINDINGS: Intrauterine gestational sac: Single Yolk sac:  Present Embryo:  Present Cardiac Activity: Possible faint cardiac activity versus maternal pulse. Heart Rate: Not well assessed due to faint signal. CRL: 2.14 mm   5 w   5 d                  [place] EDC: 01/04/2019 Subchorionic hemorrhage:  None visualized. Maternal uterus/adnexae: Ovaries are normal in appearance bilaterally. Trace free fluid noted within the posterior pelvic cul-de-sac. IMPRESSION: 1. Single intrauterine pregnancy with internal yolk sac and embryo with possible  faint cardiac activity. Estimated gestational age [redacted] weeks and 5 days by crown-rump length, with ultrasound EDC of 01/04/2019. Close clinical monitoring with serial beta hCGs and close interval follow-up ultrasound to assess viability suggested. 2. No other acute maternal uterine or adnexal abnormality. Electronically Signed   By: Rise Mu M.D.   On: 05/09/2018 17:19   Orders Placed This Encounter  Procedures  . Wet prep, genital    Standing Status:   Standing    Number of Occurrences:   1    Order Specific Question:   Patient immune status    Answer:   Normal  . US OB LESS THAN 14 WEEKS WITH OB TRANSVAGINAL    Standing Status:   Standing    Number of Occurrences:   1    Order Specific Question:   Symptom/Reason for Exam    Answer:   Vaginal bleeding in pregnancy [705036]  . Urinalysis, Routine w reflex microscopic    Standing Status:   Standing    Number of Occurrences:   1  . CBC    Standing Status:   Standing    Number of Occurrences:   1  . hCG, quantitative, pregnancy    Standing Status:   Standing    Number of Occurrences:   1  . Pregnancy, urine POC    Standing Status:   Standing    Number of Occurrences:   1  . Discharge patient    Order Specific Question:   Discharge disposition    Answer:   01-Home or Self Care [1]  Order Specific Question:   Discharge patient date    Answer:   05/09/2018   MDM Labs and [place] ordered and reviewed. Early IUP identified on [place], no SCH. Discussed findings with pt. Pt recently separated from husband and plans termination. Stable for discharge home.   Assessment and Plan   1. Vaginal bleeding in pregnancy   2. Blood type, Rh positive   3. [redacted] weeks gestation of pregnancy    Discharge home Follow up at [facility] as needed SAB/bleeding precautions  Allergies as of 05/09/2018   No Known Allergies     Medication List    You have not been prescribed any medications.    Donette Larry, CNM 05/09/2018, 5:47 PM

## 2018-05-10 LAB — GC/CHLAMYDIA PROBE AMP (~~LOC~~) NOT AT ARMC
CHLAMYDIA, DNA PROBE: NEGATIVE
NEISSERIA GONORRHEA: NEGATIVE

## 2018-09-28 ENCOUNTER — Other Ambulatory Visit: Payer: Self-pay

## 2018-09-28 DIAGNOSIS — Z20822 Contact with and (suspected) exposure to covid-19: Secondary | ICD-10-CM

## 2018-09-30 LAB — NOVEL CORONAVIRUS, NAA: SARS-CoV-2, NAA: NOT DETECTED

## 2020-08-12 ENCOUNTER — Other Ambulatory Visit: Payer: Self-pay | Admitting: Adult Medicine

## 2020-08-12 DIAGNOSIS — Z1231 Encounter for screening mammogram for malignant neoplasm of breast: Secondary | ICD-10-CM

## 2021-04-29 ENCOUNTER — Ambulatory Visit (HOSPITAL_BASED_OUTPATIENT_CLINIC_OR_DEPARTMENT_OTHER): Payer: BLUE CROSS/BLUE SHIELD | Admitting: Nurse Practitioner

## 2021-04-29 ENCOUNTER — Encounter (HOSPITAL_BASED_OUTPATIENT_CLINIC_OR_DEPARTMENT_OTHER): Payer: Self-pay | Admitting: Nurse Practitioner

## 2021-04-29 ENCOUNTER — Other Ambulatory Visit: Payer: Self-pay

## 2021-04-29 VITALS — BP 128/82 | HR 87 | Ht 62.0 in | Wt 156.0 lb

## 2021-04-29 DIAGNOSIS — M25561 Pain in right knee: Secondary | ICD-10-CM | POA: Diagnosis not present

## 2021-04-29 DIAGNOSIS — G8929 Other chronic pain: Secondary | ICD-10-CM | POA: Insufficient documentation

## 2021-04-29 DIAGNOSIS — M25511 Pain in right shoulder: Secondary | ICD-10-CM | POA: Diagnosis not present

## 2021-04-29 DIAGNOSIS — R42 Dizziness and giddiness: Secondary | ICD-10-CM | POA: Diagnosis not present

## 2021-04-29 DIAGNOSIS — Z0000 Encounter for general adult medical examination without abnormal findings: Secondary | ICD-10-CM | POA: Diagnosis not present

## 2021-04-29 MED ORDER — MECLIZINE HCL 25 MG PO TABS: 25.0000 mg | ORAL_TABLET | Freq: Three times a day (TID) | ORAL | 3 refills | Status: AC | PRN

## 2021-04-29 MED ORDER — MELOXICAM 15 MG PO TABS: 15.0000 mg | ORAL_TABLET | Freq: Every day | ORAL | 2 refills | Status: AC

## 2021-04-29 NOTE — Assessment & Plan Note (Signed)
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 Unclear if sx were brought on by psychodelic or coincidental timing.  ?Recommend therapeutic maneuvers and meclizine for management.  ?If symptoms continue, will consider neurology referral for further evaluation.  ?No alarm symptoms present today  ?

## 2021-04-29 NOTE — Patient Instructions (Signed)
Thank you for choosing [place] at [facility] for your Primary Care needs. I am excited for the opportunity to partner with you to meet your health care goals. It was a pleasure meeting you today! ? ?Recommendations from today's visit: ?We will try conservative measures first with your shoulders and knees. I am going to send a medication called meloxicam to the pharmacy for you to take daily for at least the next 2 weeks. This will help with inflammation and pain. Do not take Ibuprofen or other NSAIDs while taking this medication as this is a form of an NSAID.  ?Will also give you stretches for the shoulders and knees.  ?If you are not feeling better in the next 2 weeks, let me know and we can be more aggressive.  ?I will also check your blood to see if you have the RA factor present.  ?I am sending in a medication called Meclizine to help with your vertigo and exercises to do at home. If this does not resolve, please let me know. You can take the meclizine daily for the next 2-3 days and then you can take it as needed if you notice the vertigo symptoms coming on.  ? ?Information on diet, exercise, and health maintenance recommendations are listed below. This is information to help you be sure you are on track for optimal health and monitoring.  ? ?Please look over this and let us know if you have any questions or if you have completed any of the health maintenance outside of [place] so that we can be sure your records are up to date.  ?___________________________________________________________ ?About Me: ?I am an Adult-Geriatric Nurse Practitioner with a background in caring for patients for more than 20 years with a strong intensive care background. I provide primary care and sports medicine services to patients age 64 and older within this office. My education had a strong focus on caring for the older adult population, which I am passionate about. I am also the director of the APP  Fellowship with [facility].  ? ?My desire is to provide you with the best service through preventive medicine and supportive care. I consider you a part of the medical team and value your input. I work diligently to ensure that you are heard and your needs are met in a safe and effective manner. I want you to feel comfortable with me as your provider and want you to know that your health concerns are important to me. ? ?For your information, our office hours are: ?Monday, Tuesday, and Thursday 8:00 AM - 5:00 PM ?Wednesday and Friday 8:00 AM - 12:00 PM.  ? ?In my time away from the office I am teaching new APP's within the system and am unavailable, but my partner, Dr. Burnard Bunting is in the office for emergent needs.  ? ?If you have questions or concerns, please call our office at [phone] or send [place] a MyChart message and we will respond as quickly as possible.  ?____________________________________________________________ ?MyChart:  ?For all urgent or time sensitive needs we ask that you please call the office to avoid delays. Our number is [phone]. ?MyChart is not constantly monitored and due to the large volume of messages a day, replies may take up to 72 business hours. ? ?MyChart Policy: ?MyChart allows for you to see your visit notes, after visit summary, provider recommendations, lab and tests results, make an appointment, request refills, and contact your provider or the office for non-urgent  questions or concerns. Providers are seeing patients during normal business hours and do not have built in time to review MyChart messages.  ?We ask that you allow a minimum of 3 business days for responses to [employer]. For this reason, please do not send urgent requests through [place]. Please call the office at [phone]. ?New and ongoing conditions may require a visit. We have virtual and in person visit available for your convenience.  ?Complex MyChart concerns may require a visit. Your provider may  request you schedule a virtual or in person visit to ensure we are providing the best care possible. ?MyChart messages sent after 11:00 AM on Friday will not be received by the provider until Monday morning.  ?  ?Lab and Test Results: ?You will receive your lab and test results on MyChart as soon as they are completed and results have been sent by the lab or testing facility. Due to this service, you will receive your results BEFORE your provider.  ?I review lab and tests results each morning prior to seeing patients. Some results require collaboration with other providers to ensure you are receiving the most appropriate care. For this reason, we ask that you please allow a minimum of 3-5 business days from the time the ALL results have been received for your provider to receive and review lab and test results and contact you about these.  ?Most lab and test result comments from the provider will be sent through [place]. Your provider may recommend changes to the plan of care, follow-up visits, repeat testing, ask questions, or request an office visit to discuss these results. You may reply directly to this message or call the office at [phone] to provide information for the provider or set up an appointment. ?In some instances, you will be called with test results and recommendations. Please let us know if this is preferred and we will make note of this in your chart to provide this for you.    ?If you have not heard a response to your lab or test results in 5 business days from all results returning to [place], please call the office to let us know. We ask that you please avoid calling prior to this time unless there is an emergent concern. Due to high call volumes, this can delay the resulting process. ? ?After Hours: ?For all non-emergency after hours needs, please call the office at [phone] and select the option to reach the on-call provider service. On-call services are shared between multiple [place] offices and therefore it will not be possible to speak directly with your provider. On-call providers may provide medical advice and recommendations, but are unable to provide refills for maintenance medications.  ?For all emergency or urgent medical needs after normal business hours, we recommend that you seek care at the closest Urgent Care or Emergency Department to ensure appropriate treatment in a timely manner.  ?[facility] [place] has a 24 hour emergency room located on the ground floor for your convenience.  ? ?Urgent Concerns During the Business Day ?Providers are seeing patients from 8AM to [place] with a busy schedule and are most often not able to respond to non-urgent calls until the end of the day or the next business day. ?If you should have URGENT concerns during the day, please call and speak to the nurse or schedule a same day appointment so that we can address your concern without delay.  ? ?Thank you, again, for choosing me as your  health care partner. I appreciate your trust and look forward to learning more about you.  ? ?Worthy Keeler, DNP, AGNP-c ?___________________________________________________________ ? ?Health Maintenance Recommendations ?Screening Testing ?Mammogram ?Every 1 -2 years based on history and risk factors ?Starting at age 85 ?Pap Smear ?Ages 21-39 every 3 years ?Ages 25-65 every 5 years with HPV testing ?More frequent testing may be required based on results and history ?Colon Cancer Screening ?Every 1-10 years based on test performed, risk factors, and history ?Starting at age 66 ?Bone Density Screening ?Every 2-10 years based on history ?Starting at age 47 for women ?Recommendations for men differ based on medication usage, history, and risk factors ?AAA Screening ?One time ultrasound ?Men 52-37 years old who have every smoked ?Lung Cancer Screening ?Low Dose Lung CT every 12 months ?Age 67-80 years with a 30 pack-year smoking history who still smoke  or who have quit within the last 15 years ? ?Screening Labs ?Routine  Labs: Complete Blood Count (CBC), Complete Metabolic Panel (CMP), Cholesterol (Lipid Panel) ?Every 6-12 months based on history and m

## 2021-04-29 NOTE — Progress Notes (Signed)
?Orma Render, DNP, AGNP-c ?Primary Care & Sports Medicine ?[place], [place] [address] ?[phone] ? ?New patient visit ? ? ?Patient: [name]   DOB: 03-30-1975   46 y.o. Female  MRN: [id number] ?Visit Date: 04/29/2021 ? ?Patient Care Team: ?Licia Harl, Coralee Pesa, NP as PCP - General (Nurse Practitioner) ? ?Today's healthcare provider: Orma Render, NP  ? ?Chief Complaint  ?Patient presents with  ? New Patient (Initial Visit)  ?  Patient presents today as new patient and establish care. She would like a referral for bilateral shoulder pain. She would also like to discuss vertigo. She would like labs today   ? ?Subjective  ?  ?[name] is a 46 y.o. female who presents today as a new patient to establish care.  ?  ?Patient endorses the following concerns presently: ?Shoulder Pain ?- started in last month or so ?- first noticed when driving ?- Drives a lot back and forth to [place] ?- hurts even when laying down on her side ?- at it's worst she has noticed some 10/10 moments- on average 5/10 ?- does try to avoid movements that cause pain ?- she can feel popping/creaking in the shoulder joints on occasion more on the right than the left ?- No known injury ?- Pain with reaching overhead or behind the back ?- feels weak in the arms and when the pain is triggered she must immediately bring the arms down ?- grandmother had RA  ? ?Knee Pain ?- noticed pain when going down stairs or downhill or uphill ?- has been going on for 6 months or longer ?- walks normally and no pain on a flat surface ?- pain located behind the kneecap ?- No known injury ?- No popping, creaking, cracking in the knees ?- ROM intact ?- No weakness in the knee or feeling like the knee will give out ?- No swelling or warmth to the knee joint ?- at its worst the pain is 7/10 ?- sits a lot for work ? ?Vertigo ?- She and her husband tried psychodelic mushrooms for the first time about 2 weeks ago  on a Sunday ?          - felt a "trip" for about 2 days and did not sleep for 2 days following but then felt fine  ?          - has never tried any drugs in the past ?- Last Saturday (a week later) [place] appeared to rotate at a 90 degree angle ?          - was able to pull off of the road and her husband drove ?          - continued to feel like things were spinning for about 30 min ?- Sensation occurred again this morning ?- Each time it has lasted about 30 minutes then resolved ?- She has not had any recent colds or allergy symptoms ?- Feels like the world is tilting sideways ? ?History reviewed and reveals the following: ?Past Medical History:  ?Diagnosis Date  ? Anxiety   ? social  ? Blood transfusion without reported diagnosis   ? after 3rd delivery  ? Ectopic pregnancy 2018  ? received Methotrexate x2  ? Hypertension   ? ?Past Surgical History:  ?Procedure Laterality Date  ? NO PAST SURGERIES    ? ?Family Status  ?Relation Name Status  ?  Mother  Alive  ? Father  Alive  ? Neg Hx  (Not Specified)  ? ?Family History  ?Problem Relation Age of Onset  ? Hypertension Mother   ? Hypertension Father   ? Hearing loss Neg Hx   ? ?Social History  ? ?Socioeconomic History  ? Marital status: Married  ?  Spouse name: Not on file  ? Number of children: Not on file  ? Years of education: Not on file  ? Highest education level: Not on file  ?Occupational History  ? Not on file  ?Tobacco Use  ? Smoking status: Never  ? Smokeless tobacco: Never  ?Vaping Use  ? Vaping Use: Never used  ?Substance and Sexual Activity  ? Alcohol use: Yes  ?  Comment: twice a wk  ? Drug use: No  ? Sexual activity: Not Currently  ?  Birth control/protection: None  ?Other Topics Concern  ? Not on file  ?Social History Narrative  ? Not on file  ? ?Social Determinants of Health  ? ?Financial Resource Strain: Not on file  ?Food Insecurity: Not on file  ?Transportation Needs: Not on file  ?Physical Activity: Not on  file  ?Stress: Not on file  ?Social Connections: Not on file  ? ?No outpatient medications prior to visit.  ? ?No facility-administered medications prior to visit.  ? ?No Known Allergies ? ?There is no immunization history on file for this patient. ? ?Health Maintenance Due: ?Health Maintenance  ?Topic Date Due  ? HIV Screening  Never done  ? Hepatitis C Screening  Never done  ? TETANUS/TDAP  Never done  ? PAP SMEAR-Modifier  06/27/2013  ? COLONOSCOPY (Pts 45-43yrs Insurance coverage will need to be confirmed)  Never done  ? INFLUENZA VACCINE  05/14/2021 (Originally 09/14/2020)  ? HPV VACCINES  Aged Out  ? ? ?Review of Systems ?All review of systems negative except what is listed in the HPI ? ? Objective  ?  ?BP 128/82   Pulse 87   Ht 5\' 2"  (1.575 m)   Wt 156 lb (70.8 kg)   SpO2 97%   BMI 28.53 kg/m?  ?Physical Exam ?Vitals and nursing note reviewed.  ?Constitutional:   ?   General: She is not in acute distress. ?   Appearance: Normal appearance.  ?Eyes:  ?   General: Lids are normal. Vision grossly intact. Gaze aligned appropriately. No visual field deficit. ?   Extraocular Movements:  ?   Right eye: Nystagmus present. Normal extraocular motion.  ?   Left eye: Nystagmus present. Normal extraocular motion.  ?   Conjunctiva/sclera: Conjunctivae normal.  ?   Pupils: Pupils are equal, round, and reactive to light.  ?Neck:  ?   Vascular: No carotid bruit.  ?Cardiovascular:  ?   Rate and Rhythm: Normal rate and regular rhythm.  ?   Pulses: Normal pulses.  ?   Heart sounds: Normal heart sounds. No murmur heard. ?Pulmonary:  ?   Effort: Pulmonary effort is normal.  ?   Breath sounds: Normal breath sounds. No wheezing.  ?Abdominal:  ?   General: Bowel sounds are normal.  ?   Palpations: Abdomen is soft.  ?Musculoskeletal:  ?   Right shoulder: Tenderness present. No swelling, effusion or crepitus. Decreased range of motion. Normal strength. Normal pulse.  ?   Left shoulder: Tenderness present. No swelling, effusion or  crepitus. Decreased range of motion. Normal strength. Normal pulse.  ?   Cervical back: Normal range of motion.  ?  Right knee: Normal. No bony tenderness or crepitus. Normal range of motion. No tenderness. Normal alignment.  ?   Instability Tests: Anterior drawer test negative. Posterior drawer test negative. Medial McMurray test negative and lateral McMurray test negative.  ?   Left knee: Normal. No bony tenderness or crepitus. Normal range of motion. No tenderness. Normal alignment.  ?   Instability Tests: Anterior drawer test negative. Posterior drawer test negative. Medial McMurray test negative and lateral McMurray test negative.  ?   Right lower leg: No edema.  ?   Left lower leg: No edema.  ?   Comments: Pain with abduction past 90 degrees bilaterally with shoulders.  ?No decreased strength.  ?Passive ROM intact, pain illicited.   ?Skin: ?   General: Skin is warm and dry.  ?   Capillary Refill: Capillary refill takes less than 2 seconds.  ?Neurological:  ?   General: No focal deficit present.  ?   Mental Status: She is alert and oriented to person, place, and time.  ?   Cranial Nerves: No dysarthria or facial asymmetry.  ?   Sensory: Sensation is intact.  ?   Motor: Motor function is intact. No weakness.  ?   Coordination: Coordination is intact. Romberg sign negative.  ?   Gait: Gait and tandem walk normal.  ?Psychiatric:     ?   Attention and Perception: Attention normal.     ?   Mood and Affect: Mood normal.     ?   Speech: Speech normal.     ?   Behavior: Behavior normal. Behavior is cooperative.     ?   Thought Content: Thought content normal.     ?   Cognition and Memory: Cognition and memory normal.     ?   Judgment: Judgment normal.  ? ? ?No results found for any visits on 04/29/21. ? Assessment & Plan   ?  ? ?Problem List Items Addressed This Visit   ? ? Chronic pain of both shoulders - Primary  ?  Pain elicited with passive range of motion at 90 degrees and greater with abduction bilaterally.   Pulses, sensation, color intact.  No edema or crepitus noted. 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 ?Consider possible tendinitis versus arthritis versus

## 2021-04-29 NOTE — Assessment & Plan Note (Signed)
Bilateral knee pain when walking up or down the stairs.  Range of motion intact.  No crepitus or abnormality in alignment present.  No edema or erythema present. ?Consider possible arthritis versus patellofemoral syndrome. ?Recommend gentle stretching exercises and meloxicam.  Patient provided with handout for exercises. ?If symptoms worsen or fail to improve after 2 weeks of consistent use will follow-up with imaging and consider orthopedic evaluation. ?

## 2021-04-29 NOTE — Assessment & Plan Note (Signed)
Pain elicited with passive range of motion at 90 degrees and greater with abduction bilaterally.  Pulses, sensation, color intact.  No edema or crepitus noted. ?Patient does work on a [employer] for 12 to 16 hours a day and frequently drives long distances. ?Consider possible tendinitis versus arthritis versus muscle deconditioning could be cause. ?Discussed with patient option to obtain imaging today versus conservative approach with meloxicam and at home exercises.  Joint decision made to trial at home exercises with meloxicam.  If symptoms persist beyond 2 additional weeks will consider imaging. ?

## 2021-04-30 LAB — COMPREHENSIVE METABOLIC PANEL
ALT: 6 IU/L (ref 0–32)
AST: 12 IU/L (ref 0–40)
Albumin/Globulin Ratio: 1.4 (ref 1.2–2.2)
Albumin: 4.5 g/dL (ref 3.8–4.8)
Alkaline Phosphatase: 76 IU/L (ref 44–121)
BUN/Creatinine Ratio: 8 — ABNORMAL LOW (ref 9–23)
BUN: 8 mg/dL (ref 6–24)
Bilirubin Total: 0.4 mg/dL (ref 0.0–1.2)
CO2: 22 mmol/L (ref 20–29)
Calcium: 9.6 mg/dL (ref 8.7–10.2)
Chloride: 103 mmol/L (ref 96–106)
Creatinine, Ser: 0.96 mg/dL (ref 0.57–1.00)
Globulin, Total: 3.3 g/dL (ref 1.5–4.5)
Glucose: 92 mg/dL (ref 70–99)
Potassium: 4.2 mmol/L (ref 3.5–5.2)
Sodium: 139 mmol/L (ref 134–144)
Total Protein: 7.8 g/dL (ref 6.0–8.5)
eGFR: 74 mL/min/{1.73_m2} (ref >59)

## 2021-04-30 LAB — HEMOGLOBIN A1C
Est. average glucose Bld gHb Est-mCnc: 108 mg/dL
Hgb A1c MFr Bld: 5.4 % (ref 4.8–5.6)

## 2021-04-30 LAB — CBC WITH DIFFERENTIAL/PLATELET
Basophils Absolute: 0 10*3/uL (ref 0.0–0.2)
Basos: 0 %
EOS (ABSOLUTE): 0.1 10*3/uL (ref 0.0–0.4)
Eos: 1 %
Hematocrit: 41.7 % (ref 34.0–46.6)
Hemoglobin: 14.1 g/dL (ref 11.1–15.9)
Immature Grans (Abs): 0 10*3/uL (ref 0.0–0.1)
Immature Granulocytes: 0 %
Lymphocytes Absolute: 2.7 10*3/uL (ref 0.7–3.1)
Lymphs: 40 %
MCH: 32.2 pg (ref 26.6–33.0)
MCHC: 33.8 g/dL (ref 31.5–35.7)
MCV: 95 fL (ref 79–97)
Monocytes Absolute: 0.7 10*3/uL (ref 0.1–0.9)
Monocytes: 11 %
Neutrophils Absolute: 3.2 10*3/uL (ref 1.4–7.0)
Neutrophils: 48 %
Platelets: 270 10*3/uL (ref 150–450)
RBC: 4.38 x10E6/uL (ref 3.77–5.28)
RDW: 13 % (ref 11.7–15.4)
WBC: 6.8 10*3/uL (ref 3.4–10.8)

## 2021-04-30 LAB — LIPID PANEL
Chol/HDL Ratio: 1.8 ratio (ref 0.0–4.4)
Cholesterol, Total: 123 mg/dL (ref 100–199)
HDL: 69 mg/dL (ref >39)
LDL Chol Calc (NIH): 38 mg/dL (ref 0–99)
Triglycerides: 79 mg/dL (ref 0–149)
VLDL Cholesterol Cal: 16 mg/dL (ref 5–40)

## 2021-04-30 LAB — RHEUMATOID FACTOR: Rheumatoid fact SerPl-aCnc: <10 IU/mL (ref <14.0)

## 2021-04-30 LAB — TSH: TSH: 1.88 u[IU]/mL (ref 0.450–4.500)

## 2021-04-30 LAB — VITAMIN D 25 HYDROXY (VIT D DEFICIENCY, FRACTURES): Vit D, 25-Hydroxy: 6.1 ng/mL — ABNORMAL LOW (ref 30.0–100.0)

## 2021-05-10 ENCOUNTER — Telehealth (HOSPITAL_BASED_OUTPATIENT_CLINIC_OR_DEPARTMENT_OTHER): Payer: Self-pay | Admitting: Nurse Practitioner

## 2021-05-10 NOTE — Telephone Encounter (Signed)
Pt called returning a call form office about lab results. Read results word for word of what provider stated she understands and stated the Vitamin D recheck in 12 weeks she will cb to sch. ? ?Please advise. ?

## 2021-05-19 ENCOUNTER — Encounter (HOSPITAL_BASED_OUTPATIENT_CLINIC_OR_DEPARTMENT_OTHER): Payer: Self-pay
